# Patient Record
Sex: Male | Born: 1955 | Race: White | Hispanic: No | Marital: Married | Smoking: Never smoker
Health system: Southern US, Community
[De-identification: ages and names within clinical notes are randomized; demographics above are authoritative.]

## PROBLEM LIST (undated history)

## (undated) DIAGNOSIS — F329 Major depressive disorder, single episode, unspecified: Secondary | ICD-10-CM

## (undated) DIAGNOSIS — C801 Malignant (primary) neoplasm, unspecified: Secondary | ICD-10-CM

## (undated) DIAGNOSIS — K219 Gastro-esophageal reflux disease without esophagitis: Secondary | ICD-10-CM

## (undated) DIAGNOSIS — M1990 Unspecified osteoarthritis, unspecified site: Secondary | ICD-10-CM

## (undated) DIAGNOSIS — B1920 Unspecified viral hepatitis C without hepatic coma: Secondary | ICD-10-CM

## (undated) DIAGNOSIS — F32A Depression, unspecified: Secondary | ICD-10-CM

## (undated) DIAGNOSIS — G4730 Sleep apnea, unspecified: Secondary | ICD-10-CM

## (undated) DIAGNOSIS — F419 Anxiety disorder, unspecified: Secondary | ICD-10-CM

## (undated) DIAGNOSIS — K449 Diaphragmatic hernia without obstruction or gangrene: Secondary | ICD-10-CM

## (undated) DIAGNOSIS — R7303 Prediabetes: Secondary | ICD-10-CM

## (undated) DIAGNOSIS — D649 Anemia, unspecified: Secondary | ICD-10-CM

## (undated) DIAGNOSIS — I10 Essential (primary) hypertension: Secondary | ICD-10-CM

## (undated) DIAGNOSIS — T7840XA Allergy, unspecified, initial encounter: Secondary | ICD-10-CM

## (undated) DIAGNOSIS — E785 Hyperlipidemia, unspecified: Secondary | ICD-10-CM

## (undated) HISTORY — DX: Anxiety disorder, unspecified: F41.9

## (undated) HISTORY — DX: Major depressive disorder, single episode, unspecified: F32.9

## (undated) HISTORY — DX: Anemia, unspecified: D64.9

## (undated) HISTORY — DX: Sleep apnea, unspecified: G47.30

## (undated) HISTORY — DX: Unspecified osteoarthritis, unspecified site: M19.90

## (undated) HISTORY — DX: Essential (primary) hypertension: I10

## (undated) HISTORY — DX: Depression, unspecified: F32.A

## (undated) HISTORY — DX: Unspecified viral hepatitis C without hepatic coma: B19.20

## (undated) HISTORY — DX: Hyperlipidemia, unspecified: E78.5

## (undated) HISTORY — DX: Allergy, unspecified, initial encounter: T78.40XA

## (undated) HISTORY — DX: Malignant (primary) neoplasm, unspecified: C80.1

## (undated) HISTORY — PX: SHOULDER ACROMIOPLASTY: SHX6093

## (undated) HISTORY — DX: Gastro-esophageal reflux disease without esophagitis: K21.9

---

## 2016-10-14 ENCOUNTER — Other Ambulatory Visit: Payer: Self-pay | Admitting: Urology

## 2016-10-14 DIAGNOSIS — R972 Elevated prostate specific antigen [PSA]: Secondary | ICD-10-CM

## 2016-10-25 ENCOUNTER — Ambulatory Visit
Admission: RE | Admit: 2016-10-25 | Discharge: 2016-10-25 | Disposition: A | Discharge status: Home / Self Care | Payer: BLUE CROSS/BLUE SHIELD | Source: Ambulatory Visit | Attending: Urology | Admitting: Urology

## 2016-10-25 DIAGNOSIS — R972 Elevated prostate specific antigen [PSA]: Secondary | ICD-10-CM

## 2016-10-25 MED ORDER — GADOBENATE DIMEGLUMINE 529 MG/ML IV SOLN
20.0000 mL | Freq: Once | INTRAVENOUS | Status: AC | PRN
  Administered 2016-10-25: 20 mL via INTRAVENOUS

## 2018-01-25 ENCOUNTER — Other Ambulatory Visit (INDEPENDENT_AMBULATORY_CARE_PROVIDER_SITE_OTHER): Payer: BLUE CROSS/BLUE SHIELD

## 2018-01-25 ENCOUNTER — Encounter: Payer: Self-pay | Admitting: Gastroenterology

## 2018-01-25 ENCOUNTER — Ambulatory Visit (INDEPENDENT_AMBULATORY_CARE_PROVIDER_SITE_OTHER): Payer: BLUE CROSS/BLUE SHIELD | Admitting: Gastroenterology

## 2018-01-25 VITALS — BP 148/76 | HR 66 | Ht 72.0 in | Wt 273.0 lb

## 2018-01-25 DIAGNOSIS — R142 Eructation: Secondary | ICD-10-CM | POA: Diagnosis not present

## 2018-01-25 DIAGNOSIS — D509 Iron deficiency anemia, unspecified: Secondary | ICD-10-CM

## 2018-01-25 DIAGNOSIS — K2270 Barrett's esophagus without dysplasia: Secondary | ICD-10-CM | POA: Diagnosis not present

## 2018-01-25 DIAGNOSIS — K219 Gastro-esophageal reflux disease without esophagitis: Secondary | ICD-10-CM

## 2018-01-25 DIAGNOSIS — R197 Diarrhea, unspecified: Secondary | ICD-10-CM

## 2018-01-25 LAB — SEDIMENTATION RATE: SED RATE: 9 mm/h (ref 0–20)

## 2018-01-25 LAB — C-REACTIVE PROTEIN: CRP: 0.3 mg/dL — ABNORMAL LOW (ref 0.5–20.0)

## 2018-01-25 NOTE — Patient Instructions (Signed)
If you are age 60 or older, your body mass index should be between 23-30. Your Body mass index is 37.03 kg/m. If this is out of the aforementioned range listed, please consider follow up with your Primary Care Provider.  If you are age 63 or younger, your body mass index should be between 19-25. Your Body mass index is 37.03 kg/m. If this is out of the aformentioned range listed, please consider follow up with your Primary Care Provider.   .Please go to the lab on the 2nd floor [address] before you leave the office today.   It was a pleasure to see you today!  Vito Cirigliano, D.O.

## 2018-01-25 NOTE — Progress Notes (Signed)
Chief Complaint: Diarrhea   Referring Provider:     Anthony Sar, PA-C    HPI:     Chase Ramos is a 62 y.o. male referred to the Gastroenterology Clinic for evaluation of diarrhea and gas.  Was seen by his PCM on 01/10/18-sent GI path panel, C. difficile, started on simethicone, GI referral. He never submitted stool studies.   Today, he states he has had 6 weeks of increased flatus, belching, Ans diarrhea. Some periods of improvement, but not resolution. +nocturnal sxs. Having up to 20+ BMs/day on some days. Liquid, watery stools. No hematochezia or melena. Increased belching too. No preceding Abx, hospitalizations, but was started on Meloxicam approx 3 months ago for BPH.Traveled to [place] in August (pre sxs).  No prior similar sxs. No known family history of CRC, GI malignancy, liver disease, pancreatic disease, or IBD. 15-20# gain in last 2+ months despite sxs.    Past Medical History:  Diagnosis Date  . Anxiety   . Arthritis   . Depression   . GERD (gastroesophageal reflux disease)   . Hepatitis C   . HTN (hypertension)   . Sleep apnea     He was previously seen by GI at [facility] in March 2019 for routine CRC screening. He reports colonoscopy completed and n/f polyps with recommendation to repeat in 5 years. This report not in EMR but consultation notes available for review in EMR.  Initial colonoscopy completed at age 28 in [place] with polyps removed (unsure of location, number, histology).  Was then seen by [facility] GI and 2016 for GERD and ongoing polyp surveillance and iron deficiency anemia, with plan for EGD and colonoscopy and hematology referral.  Colonoscopy notable for diverticulosis with very large mouth diverticula, normal TI, small internal hemorrhoids, retained stool which limited visibility with recommendation to repeat in 1 year.  EGD notable for Barrett's esophagus (2 tongues extending 2 cm from the Z  line), 2 cm hiatal hernia, gastritis, fundic gland polyps, small paraesophageal hernia, normal duodenum.  Prior history of hepatitis C previously treated in [place] with Pegasys therapy in 2010 with SVR.  Recent labs notable in 06/2015 notable for normal CBC, (MCV 70/RDW 17.8), liver enzymes, BMP (creatinine 1.29).  Iron 26, ferritin 5, iron saturation 6% with TIBC 451.  History reviewed. No pertinent surgical history. Family History  Problem Relation Age of Onset  . Colon cancer Neg Hx    Social History   Tobacco Use  . Smoking status: Never Smoker  . Smokeless tobacco: Never Used  Substance Use Topics  . Alcohol use: Yes  . Drug use: Not on file   Current Outpatient Medications  Medication Sig Dispense Refill  . alfuzosin (UROXATRAL) 10 MG 24 hr tablet Take 10 mg by mouth daily with breakfast.    . azelastine (ASTELIN) 0.1 % nasal spray Place into both nostrils 2 (two) times daily. Use in each nostril as directed    . esomeprazole (NEXIUM) 20 MG capsule Take 20 mg by mouth daily at 12 noon.    . fluticasone (FLONASE) 50 MCG/ACT nasal spray Place into both nostrils daily.    . meloxicam (MOBIC) 15 MG tablet Take 15 mg by mouth daily.    . montelukast (SINGULAIR) 10 MG tablet Take 10 mg by mouth at bedtime.    . pramipexole (MIRAPEX) 1 MG tablet Take 1 mg by mouth 3 (three) times daily.    Marland Kitchen  valsartan-hydrochlorothiazide (DIOVAN-HCT) 320-12.5 MG tablet Take 1 tablet by mouth daily.     No current facility-administered medications for this visit.    No Known Allergies   Review of Systems: All systems reviewed and negative except where noted in HPI.     Physical Exam:    Wt Readings from Last 3 Encounters:  01/25/18 273 lb (123.8 kg)    BP (!) 148/76   Pulse 66   Ht 6' (1.829 m)   Wt 273 lb (123.8 kg)   BMI 37.03 kg/m  Constitutional:  Pleasant, in no acute distress. Psychiatric: Normal mood and affect. Behavior is normal. EENT: Pupils normal.  Conjunctivae  are normal. No scleral icterus. Neck supple. No cervical LAD. Cardiovascular: Normal rate, regular rhythm. No edema Pulmonary/chest: Effort normal and breath sounds normal. No wheezing, rales or rhonchi. Abdominal: Soft, nondistended, nontender. Bowel sounds active throughout. There are no masses palpable. No hepatomegaly. Neurological: Alert and oriented to person place and time. Skin: Skin is warm and dry. No rashes noted.   ASSESSMENT AND PLAN;   Chase Ramos is a 62 y.o. male presenting with:  1) Diarrhea: - Stool studies and ESR/CRP - If unrevealing, will proceed with EGD and colonoscopy with random and directed biopsies -Low FODMAP diet  2) BE:  Previous history of short segment (2 cm) Barrett's esophagus diagnosed at [facility] in 2016, without any repeat EGD.  Discussed most current societal guidelines, which recommend repeat EGD within 1 year of initial diagnosis to rule out any overlooked dysplasia.  Otherwise, appropriately on PPI therapy.  -Will need repeat EGD.  We will coordinate timing and whether or not this done in conjunction with colonoscopy pending above work-up - Resume Nexium  3) GERD: - Controlled with Nexium -Resume antireflux lifestyle measures  4) Iron def without anemia: - Duodenal bxs at time of EGD.  Does not appear these were done at time of his last EGD - May need to consider small bowel interrogation  - Ongoing for years per EMR. No prior small bowel interrogation noted on review of notes -May consider hematology referral  RTC in 1 to 2 months or sooner as needed  I spent a total of 45 minutes of face-to-face time with the patient. Greater than 50% of the time was spent counseling and coordinating care.     [name], DO, FACG  01/25/2018, 12:40 PM   [facility], [facility]  

## 2018-01-26 ENCOUNTER — Other Ambulatory Visit: Payer: BLUE CROSS/BLUE SHIELD

## 2018-01-26 DIAGNOSIS — R142 Eructation: Secondary | ICD-10-CM

## 2018-01-26 DIAGNOSIS — K2270 Barrett's esophagus without dysplasia: Secondary | ICD-10-CM

## 2018-01-26 DIAGNOSIS — R197 Diarrhea, unspecified: Secondary | ICD-10-CM

## 2018-01-29 LAB — FECAL FAT, QUALITATIVE: FECAL FAT, QUALITATIVE: NORMAL

## 2018-02-01 ENCOUNTER — Telehealth: Payer: Self-pay | Admitting: Gastroenterology

## 2018-02-01 LAB — FECAL LACTOFERRIN, QUANT
Fecal Lactoferrin: NEGATIVE
MICRO NUMBER:: 91225425
SPECIMEN QUALITY: ADEQUATE

## 2018-02-01 LAB — GASTROINTESTINAL PATHOGEN PANEL PCR
C. difficile Tox A/B, PCR: NOT DETECTED
CAMPYLOBACTER, PCR: NOT DETECTED
CRYPTOSPORIDIUM, PCR: NOT DETECTED
E COLI (STEC) STX1/STX2, PCR: NOT DETECTED
E coli (ETEC) LT/ST PCR: NOT DETECTED
E coli 0157, PCR: NOT DETECTED
Giardia lamblia, PCR: NOT DETECTED
NOROVIRUS, PCR: NOT DETECTED
ROTAVIRUS, PCR: NOT DETECTED
SALMONELLA, PCR: NOT DETECTED
Shigella, PCR: NOT DETECTED

## 2018-02-01 NOTE — Telephone Encounter (Signed)
Patient requesting results

## 2018-02-01 NOTE — Telephone Encounter (Signed)
Still waiting on the GI path panel, but the fecal lactoferrin is negative fecal fat is negative and normal ESR and CRP.  So all is normal so far.  Will contact when GI path panel has resulted. Thank you.

## 2018-02-01 NOTE — Telephone Encounter (Signed)
Patient notified of the results  

## 2018-02-05 ENCOUNTER — Ambulatory Visit (AMBULATORY_SURGERY_CENTER): Payer: Self-pay

## 2018-02-05 VITALS — Ht 72.0 in | Wt 273.0 lb

## 2018-02-05 DIAGNOSIS — R197 Diarrhea, unspecified: Secondary | ICD-10-CM

## 2018-02-05 MED ORDER — NA SULFATE-K SULFATE-MG SULF 17.5-3.13-1.6 GM/177ML PO SOLN: 1.0000 | Freq: Once | ORAL | 0 refills | Status: AC

## 2018-02-05 NOTE — Progress Notes (Signed)
Denies allergies to eggs or soy products. Denies complication of anesthesia or sedation. Denies use of weight loss medication. Denies use of O2.   Emmi instructions declined.  

## 2018-02-07 ENCOUNTER — Encounter: Payer: Self-pay | Admitting: Gastroenterology

## 2018-02-21 ENCOUNTER — Encounter: Payer: BLUE CROSS/BLUE SHIELD | Admitting: Gastroenterology

## 2018-03-01 ENCOUNTER — Ambulatory Visit (AMBULATORY_SURGERY_CENTER): Payer: BLUE CROSS/BLUE SHIELD | Admitting: Gastroenterology

## 2018-03-01 ENCOUNTER — Encounter: Payer: Self-pay | Admitting: Gastroenterology

## 2018-03-01 VITALS — BP 121/70 | HR 77 | Temp 96.9°F | Resp 20 | Ht 72.0 in | Wt 273.0 lb

## 2018-03-01 DIAGNOSIS — K635 Polyp of colon: Secondary | ICD-10-CM

## 2018-03-01 DIAGNOSIS — K5730 Diverticulosis of large intestine without perforation or abscess without bleeding: Secondary | ICD-10-CM

## 2018-03-01 DIAGNOSIS — K2970 Gastritis, unspecified, without bleeding: Secondary | ICD-10-CM

## 2018-03-01 DIAGNOSIS — K2270 Barrett's esophagus without dysplasia: Secondary | ICD-10-CM

## 2018-03-01 DIAGNOSIS — K219 Gastro-esophageal reflux disease without esophagitis: Secondary | ICD-10-CM | POA: Diagnosis not present

## 2018-03-01 DIAGNOSIS — R197 Diarrhea, unspecified: Secondary | ICD-10-CM | POA: Diagnosis not present

## 2018-03-01 DIAGNOSIS — D122 Benign neoplasm of ascending colon: Secondary | ICD-10-CM

## 2018-03-01 DIAGNOSIS — K449 Diaphragmatic hernia without obstruction or gangrene: Secondary | ICD-10-CM | POA: Diagnosis not present

## 2018-03-01 DIAGNOSIS — K2990 Gastroduodenitis, unspecified, without bleeding: Secondary | ICD-10-CM

## 2018-03-01 MED ORDER — SODIUM CHLORIDE 0.9 % IV SOLN: 500.0000 mL | Freq: Once | INTRAVENOUS | Status: DC

## 2018-03-01 NOTE — Patient Instructions (Signed)
Handouts Provided:  Polys, Gastritis, and Hiatal Hernia  Use Fiber, for example:  Citrucel, Fibercon, Konsyl, or Metamucil  YOU HAD AN ENDOSCOPIC PROCEDURE TODAY AT [place]:   Refer to the procedure report that was given to you for any specific questions about what was found during the examination.  If the procedure report does not answer your questions, please call your gastroenterologist to clarify.  If you requested that your care partner not be given the details of your procedure findings, then the procedure report has been included in a sealed envelope for you to review at your convenience later.  YOU SHOULD EXPECT: Some feelings of bloating in the abdomen. Passage of more gas than usual.  Walking can help get rid of the air that was put into your GI tract during the procedure and reduce the bloating. If you had a lower endoscopy (such as a colonoscopy or flexible sigmoidoscopy) you may notice spotting of blood in your stool or on the toilet paper. If you underwent a bowel prep for your procedure, you may not have a normal bowel movement for a few days.  Please Note:  You might notice some irritation and congestion in your nose or some drainage.  This is from the oxygen used during your procedure.  There is no need for concern and it should clear up in a day or so.  SYMPTOMS TO REPORT IMMEDIATELY:   Following lower endoscopy (colonoscopy or flexible sigmoidoscopy):  Excessive amounts of blood in the stool  Significant tenderness or worsening of abdominal pains  Swelling of the abdomen that is new, acute  Fever of 100F or higher   Following upper endoscopy (EGD)  Vomiting of blood or coffee ground material  New chest pain or pain under the shoulder blades  Painful or persistently difficult swallowing  New shortness of breath  Fever of 100F or higher  Black, tarry-looking stools  For urgent or emergent issues, a gastroenterologist can be reached at any hour by  calling [phone].   DIET:  We do recommend a small meal at first, but then you may proceed to your regular diet.  Drink plenty of fluids but you should avoid alcoholic beverages for 24 hours.  ACTIVITY:  You should plan to take it easy for the rest of today and you should NOT DRIVE or use heavy machinery until tomorrow (because of the sedation medicines used during the test).    FOLLOW UP: Our staff will call the number listed on your records the next business day following your procedure to check on you and address any questions or concerns that you may have regarding the information given to you following your procedure. If we do not reach you, we will leave a message.  However, if you are feeling well and you are not experiencing any problems, there is no need to return our call.  We will assume that you have returned to your regular daily activities without incident.  If any biopsies were taken you will be contacted by phone or by letter within the next 1-3 weeks.  Please call us at [phone] if you have not heard about the biopsies in 3 weeks.    SIGNATURES/CONFIDENTIALITY: You and/or your care partner have signed paperwork which will be entered into your electronic medical record.  These signatures attest to the fact that that the information above on your After Visit Summary has been reviewed and is understood.  Full responsibility of the confidentiality of this  discharge information lies with you and/or your care-partner.

## 2018-03-01 NOTE — Progress Notes (Signed)
Called to room to assist during endoscopic procedure.  Patient ID and intended procedure confirmed with present staff. Received instructions for my participation in the procedure from the performing physician.  

## 2018-03-01 NOTE — Op Note (Signed)
[place] Patient Name: Chase Ramos Procedure Date: 03/01/2018 1:58 PM MRN: [id number] Endoscopist: Gerrit Heck , MD Age: 62 Referring MD:  Date of Birth: [birth year] Gender: Male Account #: [id number] Procedure:                Upper GI endoscopy Indications:              Esophageal reflux, Follow-up of Barrett's                            esophagus, Diarrhea, Eructation Medicines:                Monitored Anesthesia Care Procedure:                Pre-Anesthesia Assessment:                           - Prior to the procedure, a History and Physical                            was performed, and patient medications and                            allergies were reviewed. The patient's tolerance of                            previous anesthesia was also reviewed. The risks                            and benefits of the procedure and the sedation                            options and risks were discussed with the patient.                            All questions were answered, and informed consent                            was obtained. Prior Anticoagulants: The patient has                            taken no previous anticoagulant or antiplatelet                            agents. ASA Grade Assessment: II - A patient with                            mild systemic disease. After reviewing the risks                            and benefits, the patient was deemed in                            satisfactory condition to undergo the procedure.  After obtaining informed consent, the endoscope was                            passed under direct vision. Throughout the                            procedure, the patient's blood pressure, pulse, and                            oxygen saturations were monitored continuously. The                            Model GIF-HQ190 [id number]) scope was introduced                            through the mouth, and  advanced to the second part                            of duodenum. The upper GI endoscopy was                            accomplished without difficulty. The patient                            tolerated the procedure well. Scope In: Scope Out: Findings:                 Esophagogastric landmarks were identified: the                            Z-line was found at 33 cm, the gastroesophageal                            junction was found at 35 cm and the site of hiatal                            narrowing was found at 44 cm from the incisors.                           A 9 cm hernia was present.                           The esophagus and gastroesophageal junction were                            examined with white light. There were esophageal                            mucosal changes classified as Barrett's stage C1-M2                            per Prague criteria. These changes involved the  mucosa at the upper extent of the gastric folds (35                            cm from the incisors) extending to the Z-line (33                            cm from the incisors). Circumferential                            salmon-colored mucosa was present from 34 to 35 cm                            and two tongues of salmon-colored mucosa were                            present from 33 to 34 cm. The maximum longitudinal                            extent of these esophageal mucosal changes was 2 cm                            in length. Mucosa was biopsied with a cold forceps                            for histology in 4 quadrants at intervals of 2 cm                            at 33 and 35 cm from the incisors. A total of 2                            specimen bottles were sent to pathology. Estimated                            blood loss was minimal.                           A large type-III paraesophageal hernia was found.                            The proximal extent of  the gastric folds (end of                            tubular esophagus) was 35 cm from the incisors. The                            hiatal narrowing was 44 cm from the incisors.                           Patchy mildly erythematous mucosa without bleeding                            was found in  the gastric fundus and in the gastric                            body. Biopsies were taken with a cold forceps for                            Helicobacter pylori testing. Estimated blood loss                            was minimal.                           The gastric antrum and pylorus were normal.                            Biopsies were taken with a cold forceps for                            Helicobacter pylori testing. Estimated blood loss                            was minimal.                           The duodenal bulb, first portion of the duodenum                            and second portion of the duodenum were normal.                            Biopsies for histology were taken with a cold                            forceps for evaluation of celiac disease. Estimated                            blood loss was minimal. Complications:            No immediate complications. Estimated Blood Loss:     Estimated blood loss was minimal. Impression:               - Esophagogastric landmarks identified.                           - 9 cm hiatal hernia. Endoscopic appearance seems                            most consistent with type 3 hernia.                           - Esophageal mucosal changes classified as                            Barrett's stage C1-M2 per Prague criteria. Biopsied.                           -  Erythematous mucosa in the gastric fundus and                            gastric body. Biopsied.                           - Normal antrum and pylorus. Biopsied.                           - Normal duodenal bulb, first portion of the                            duodenum and second portion  of the duodenum.                            Biopsied. Recommendation:           - Patient has a contact number available for                            emergencies. The signs and symptoms of potential                            delayed complications were discussed with the                            patient. Return to normal activities tomorrow.                            Written discharge instructions were provided to the                            patient.                           - Resume previous diet today.                           - Continue present medications.                           - Await pathology results.                           - Refer to a surgeon at appointment to be scheduled. Gerrit Heck, MD 03/01/2018 2:49:53 PM

## 2018-03-01 NOTE — Progress Notes (Signed)
Report to PACU, RN, vss, BBS= Clear.  

## 2018-03-01 NOTE — Progress Notes (Signed)
Pt's states no medical or surgical changes since previsit or office visit. 

## 2018-03-01 NOTE — Op Note (Signed)
[place] Patient Name: Chase Ramos Procedure Date: 03/01/2018 1:57 PM MRN: [id number] Endoscopist: Gerrit Heck , MD Age: 62 Referring MD:  Date of Birth: 1955/12/16 Gender: Male Account #: [id number] Procedure:                Colonoscopy Indications:              Diarrhea Medicines:                Monitored Anesthesia Care Procedure:                Pre-Anesthesia Assessment:                           - Prior to the procedure, a History and Physical                            was performed, and patient medications and                            allergies were reviewed. The patient's tolerance of                            previous anesthesia was also reviewed. The risks                            and benefits of the procedure and the sedation                            options and risks were discussed with the patient.                            All questions were answered, and informed consent                            was obtained. Prior Anticoagulants: The patient has                            taken no previous anticoagulant or antiplatelet                            agents. ASA Grade Assessment: II - A patient with                            mild systemic disease. After reviewing the risks                            and benefits, the patient was deemed in                            satisfactory condition to undergo the procedure.                           After obtaining informed consent, the colonoscope  was passed under direct vision. Throughout the                            procedure, the patient's blood pressure, pulse, and                            oxygen saturations were monitored continuously. The                            Model CF-HQ190L [id number]) scope was introduced                            through the anus and advanced to the the cecum,                            identified by appendiceal orifice and ileocecal                          valve. The colonoscopy was performed without                            difficulty. The patient tolerated the procedure                            well. The quality of the bowel preparation was fair. Scope In: 2:18:44 PM Scope Out: 2:38:54 PM Scope Withdrawal Time: 0 hours 16 minutes 0 seconds  Total Procedure Duration: 0 hours 20 minutes 10 seconds  Findings:                 A 4 mm polyp was found in the ascending colon. The                            polyp was sessile. The polyp was removed with a                            cold snare. Resection and retrieval were complete.                            Estimated blood loss was minimal.                           The perianal and digital rectal examinations were                            normal.                           There was a small lipoma, in the distal transverse                            colon. Biopsies were taken via bite-on-bite                            technique with a cold forceps for histology.  Estimated blood loss was minimal.                           Many small and large-mouthed diverticula were found                            in the sigmoid colon, descending colon and                            ascending colon.                           A moderate amount of semi-solid stool was found in                            the proximal ascending colon and in the cecum,                            interfering with visualization. Lavage of the area                            was performed using copious amounts of sterile                            water, resulting in incomplete clearance with fair                            visualization.                           Normal mucosa was found in the entire colon.                            Biopsies for histology were taken with a cold                            forceps from the right colon and left colon for                             evaluation of microscopic colitis. Estimated blood                            loss was minimal.                           The retroflexed view of the distal rectum and anal                            verge was normal and showed no anal or rectal                            abnormalities. Complications:            No immediate complications. Estimated Blood Loss:     Estimated blood loss was minimal. Impression:               -  Preparation of the colon was fair.                           - One 4 mm polyp in the ascending colon, removed                            with a cold snare. Resected and retrieved.                           - Small lipoma in the distal transverse colon.                            Biopsied.                           - Diverticulosis in the sigmoid colon, in the                            descending colon and in the ascending colon.                           - Stool in the proximal ascending colon and in the                            cecum.                           - Normal mucosa in the entire examined colon.                            Biopsied.                           - The distal rectum and anal verge are normal on                            retroflexion view. Recommendation:           - Patient has a contact number available for                            emergencies. The signs and symptoms of potential                            delayed complications were discussed with the                            patient. Return to normal activities tomorrow.                            Written discharge instructions were provided to the                            patient.                           -  Resume previous diet today.                           - Continue present medications.                           - Await pathology results.                           - Use fiber, for example Citrucel, Fibercon, Konsyl                            or Metamucil.                            - Return to GI clinic PRN. Gerrit Heck, MD 03/01/2018 2:55:17 PM

## 2018-03-02 ENCOUNTER — Telehealth: Payer: Self-pay

## 2018-03-02 NOTE — Telephone Encounter (Signed)
  Follow up Call-  Call back number 03/01/2018  Post procedure Call Back phone  # [phone]  Permission to leave phone message Yes     Patient questions:  Do you have a fever, pain , or abdominal swelling? No. Pain Score  0 *  Have you tolerated food without any problems? Yes.    Have you been able to return to your normal activities? Yes.    Do you have any questions about your discharge instructions: Diet   No. Medications  No. Follow up visit  No.  Do you have questions or concerns about your Care? No.  Actions: * If pain score is 4 or above: No action needed, pain <4.

## 2018-03-13 NOTE — Progress Notes (Signed)
Per [facility] procedure report, hernia repair [facility] notified office the referral is set up with Dr. Redmond Pulling on 03/21/18 arrival at 8:30 am for appt at 9:00 am; [facility] rep stated they would contact the patient and notify of appt that has been set up;

## 2018-03-28 ENCOUNTER — Encounter: Payer: Self-pay | Admitting: Gastroenterology

## 2018-04-03 ENCOUNTER — Other Ambulatory Visit: Payer: Self-pay | Admitting: General Surgery

## 2018-04-03 DIAGNOSIS — K219 Gastro-esophageal reflux disease without esophagitis: Secondary | ICD-10-CM

## 2018-04-03 DIAGNOSIS — K449 Diaphragmatic hernia without obstruction or gangrene: Principal | ICD-10-CM

## 2018-04-09 ENCOUNTER — Other Ambulatory Visit: Payer: BLUE CROSS/BLUE SHIELD

## 2018-04-19 ENCOUNTER — Ambulatory Visit
Admission: RE | Admit: 2018-04-19 | Discharge: 2018-04-19 | Disposition: A | Discharge status: Home / Self Care | Payer: BLUE CROSS/BLUE SHIELD | Source: Ambulatory Visit | Attending: General Surgery | Admitting: General Surgery

## 2018-04-19 DIAGNOSIS — K449 Diaphragmatic hernia without obstruction or gangrene: Principal | ICD-10-CM

## 2018-04-19 DIAGNOSIS — K219 Gastro-esophageal reflux disease without esophagitis: Secondary | ICD-10-CM

## 2018-04-25 ENCOUNTER — Other Ambulatory Visit: Payer: Self-pay | Admitting: General Surgery

## 2018-04-25 ENCOUNTER — Ambulatory Visit
Admission: RE | Admit: 2018-04-25 | Discharge: 2018-04-25 | Disposition: A | Discharge status: Home / Self Care | Payer: BLUE CROSS/BLUE SHIELD | Source: Ambulatory Visit | Attending: General Surgery | Admitting: General Surgery

## 2018-04-25 ENCOUNTER — Encounter: Payer: Self-pay | Admitting: Radiology

## 2018-04-25 DIAGNOSIS — K219 Gastro-esophageal reflux disease without esophagitis: Secondary | ICD-10-CM

## 2018-04-25 DIAGNOSIS — K449 Diaphragmatic hernia without obstruction or gangrene: Principal | ICD-10-CM

## 2018-04-25 MED ORDER — IOPAMIDOL (ISOVUE-300) INJECTION 61%
100.0000 mL | Freq: Once | INTRAVENOUS | Status: AC | PRN
  Administered 2018-04-25: 100 mL via INTRAVENOUS

## 2018-04-26 ENCOUNTER — Ambulatory Visit: Payer: Self-pay | Admitting: General Surgery

## 2018-05-29 NOTE — Patient Instructions (Addendum)
Chase Ramos  05/29/2018   Your procedure is scheduled on: 06-05-2018    Report to [facility] Main  Entrance     Report to admitting at 5:30AM    Call this number if you have problems the morning of surgery [phone]     PLEASE BRING CPAP MASK AND  TUBING ONLY. DEVICE WILL BE PROVIDED!   Remember: Do not eat food or drink liquids :After Midnight. BRUSH YOUR TEETH MORNING OF SURGERY AND RINSE YOUR MOUTH OUT, NO CHEWING GUM CANDY OR MINTS.     Take these medicines the morning of surgery with A SIP OF WATER: NEXIUM, FINASTERIDE, FLONASE, MONTELUKAST, (TRAMIPEXOLE)MIRAPEX                                You may not have any metal on your body including hair pins and              piercings  Do not wear jewelry, make-up, lotions, powders or perfumes, deodorant              Men may shave face and neck.   Do not bring valuables to the hospital. [place].  Contacts, dentures or bridgework may not be worn into surgery.  Leave suitcase in the car. After surgery it may be brought to your room.                  Please read over the following fact sheets you were given: _____________________________________________________________________             [facility] - Preparing for Surgery Before surgery, you can play an important role.  Because skin is not sterile, your skin needs to be as free of germs as possible.  You can reduce the number of germs on your skin by washing with CHG (chlorahexidine gluconate) soap before surgery.  CHG is an antiseptic cleaner which kills germs and bonds with the skin to continue killing germs even after washing. Please DO NOT use if you have an allergy to CHG or antibacterial soaps.  If your skin becomes reddened/irritated stop using the CHG and inform your nurse when you arrive at Short Stay. Do not shave (including legs and underarms) for at least 48 hours prior to the first  CHG shower.  You may shave your face/neck. Please follow these instructions carefully:  1.  Shower with CHG Soap the night before surgery and the  morning of Surgery.  2.  If you choose to wash your hair, wash your hair first as usual with your  normal  shampoo.  3.  After you shampoo, rinse your hair and body thoroughly to remove the  shampoo.                           4.  Use CHG as you would any other liquid soap.  You can apply chg directly  to the skin and wash                       Gently with a scrungie or clean washcloth.  5.  Apply the CHG Soap to your body ONLY FROM THE NECK DOWN.   Do not  use on face/ open                           Wound or open sores. Avoid contact with eyes, ears mouth and genitals (private parts).                       Wash face,  Genitals (private parts) with your normal soap.             6.  Wash thoroughly, paying special attention to the area where your surgery  will be performed.  7.  Thoroughly rinse your body with warm water from the neck down.  8.  DO NOT shower/wash with your normal soap after using and rinsing off  the CHG Soap.                9.  Pat yourself dry with a clean towel.            10.  Wear clean pajamas.            11.  Place clean sheets on your bed the night of your first shower and do not  sleep with pets. Day of Surgery : Do not apply any lotions/deodorants the morning of surgery.  Please wear clean clothes to the hospital/surgery center.  FAILURE TO FOLLOW THESE INSTRUCTIONS MAY RESULT IN THE CANCELLATION OF YOUR SURGERY PATIENT SIGNATURE_________________________________  NURSE SIGNATURE__________________________________  ________________________________________________________________________   Chase Ramos  An incentive spirometer is a tool that can help keep your lungs clear and active. This tool measures how well you are filling your lungs with each breath. Taking long deep breaths may help reverse or decrease the  chance of developing breathing (pulmonary) problems (especially infection) following:  A long period of time when you are unable to move or be active. BEFORE THE PROCEDURE   If the spirometer includes an indicator to show your best effort, your nurse or respiratory therapist will set it to a desired goal.  If possible, sit up straight or lean slightly forward. Try not to slouch.  Hold the incentive spirometer in an upright position. INSTRUCTIONS FOR USE  1. Sit on the edge of your bed if possible, or sit up as far as you can in bed or on a chair. 2. Hold the incentive spirometer in an upright position. 3. Breathe out normally. 4. Place the mouthpiece in your mouth and seal your lips tightly around it. 5. Breathe in slowly and as deeply as possible, raising the piston or the ball toward the top of the column. 6. Hold your breath for 3-5 seconds or for as long as possible. Allow the piston or ball to fall to the bottom of the column. 7. Remove the mouthpiece from your mouth and breathe out normally. 8. Rest for a few seconds and repeat Steps 1 through 7 at least 10 times every 1-2 hours when you are awake. Take your time and take a few normal breaths between deep breaths. 9. The spirometer may include an indicator to show your best effort. Use the indicator as a goal to work toward during each repetition. 10. After each set of 10 deep breaths, practice coughing to be sure your lungs are clear. If you have an incision (the cut made at the time of surgery), support your incision when coughing by placing a pillow or rolled up towels firmly against it. Once you are able to get out of bed, walk  around indoors and cough well. You may stop using the incentive spirometer when instructed by your caregiver.  RISKS AND COMPLICATIONS  Take your time so you do not get dizzy or light-headed.  If you are in pain, you may need to take or ask for pain medication before doing incentive spirometry. It is harder  to take a deep breath if you are having pain. AFTER USE  Rest and breathe slowly and easily.  It can be helpful to keep track of a log of your progress. Your caregiver can provide you with a simple table to help with this. If you are using the spirometer at home, follow these instructions: [place] IF:   You are having difficultly using the spirometer.  You have trouble using the spirometer as often as instructed.  Your pain medication is not giving enough relief while using the spirometer.  You develop fever of 100.5 F (38.1 C) or higher. SEEK IMMEDIATE MEDICAL CARE IF:   You cough up bloody sputum that had not been present before.  You develop fever of 102 F (38.9 C) or greater.  You develop worsening pain at or near the incision site. MAKE SURE YOU:   Understand these instructions.  Will watch your condition.  Will get help right away if you are not doing well or get worse. Document Released: 08/15/2006 Document Revised: 06/27/2011 Document Reviewed: 10/16/2006 [facility] Patient Information 2014 [place], [place].   ________________________________________________________________________

## 2018-05-29 NOTE — Progress Notes (Signed)
CXR 06-19-17 Epic CARE EVERYWHERE   ECHO 06-02-17 Epic CARE EVERYWHERE

## 2018-05-30 ENCOUNTER — Encounter (HOSPITAL_COMMUNITY)
Admission: RE | Admit: 2018-05-30 | Discharge: 2018-05-30 | Disposition: A | Discharge status: Home / Self Care | Payer: BLUE CROSS/BLUE SHIELD | Source: Ambulatory Visit | Attending: General Surgery | Admitting: General Surgery

## 2018-05-30 ENCOUNTER — Encounter (HOSPITAL_COMMUNITY): Payer: Self-pay

## 2018-05-30 ENCOUNTER — Other Ambulatory Visit: Payer: Self-pay

## 2018-05-30 DIAGNOSIS — I10 Essential (primary) hypertension: Secondary | ICD-10-CM | POA: Diagnosis not present

## 2018-05-30 DIAGNOSIS — Z01818 Encounter for other preprocedural examination: Secondary | ICD-10-CM | POA: Insufficient documentation

## 2018-05-30 HISTORY — DX: Prediabetes: R73.03

## 2018-05-30 HISTORY — DX: Diaphragmatic hernia without obstruction or gangrene: K44.9

## 2018-05-30 LAB — CBC WITH DIFFERENTIAL/PLATELET
Abs Immature Granulocytes: 0.09 10*3/uL — ABNORMAL HIGH (ref 0.00–0.07)
BASOS PCT: 1 %
Basophils Absolute: 0.1 10*3/uL (ref 0.0–0.1)
Eosinophils Absolute: 0.3 10*3/uL (ref 0.0–0.5)
Eosinophils Relative: 4 %
HCT: 40.2 % (ref 39.0–52.0)
Hemoglobin: 12.3 g/dL — ABNORMAL LOW (ref 13.0–17.0)
Immature Granulocytes: 1 %
Lymphocytes Relative: 17 %
Lymphs Abs: 1.3 10*3/uL (ref 0.7–4.0)
MCH: 23.8 pg — ABNORMAL LOW (ref 26.0–34.0)
MCHC: 30.6 g/dL (ref 30.0–36.0)
MCV: 77.8 fL — ABNORMAL LOW (ref 80.0–100.0)
Monocytes Absolute: 0.6 10*3/uL (ref 0.1–1.0)
Monocytes Relative: 8 %
Neutro Abs: 5.4 10*3/uL (ref 1.7–7.7)
Neutrophils Relative %: 69 %
PLATELETS: 252 10*3/uL (ref 150–400)
RBC: 5.17 MIL/uL (ref 4.22–5.81)
RDW: 16.8 % — ABNORMAL HIGH (ref 11.5–15.5)
WBC: 7.7 10*3/uL (ref 4.0–10.5)
nRBC: 0 % (ref 0.0–0.2)

## 2018-05-30 LAB — COMPREHENSIVE METABOLIC PANEL
ALT: 23 U/L (ref 0–44)
AST: 22 U/L (ref 15–41)
Albumin: 4 g/dL (ref 3.5–5.0)
Alkaline Phosphatase: 49 U/L (ref 38–126)
Anion gap: 6 (ref 5–15)
BUN: 24 mg/dL — ABNORMAL HIGH (ref 6–20)
CO2: 23 mmol/L (ref 22–32)
Calcium: 8.7 mg/dL — ABNORMAL LOW (ref 8.9–10.3)
Chloride: 108 mmol/L (ref 98–111)
Creatinine, Ser: 1.07 mg/dL (ref 0.61–1.24)
GFR calc Af Amer: >60 mL/min (ref >60)
GFR calc non Af Amer: >60 mL/min (ref >60)
Glucose, Bld: 128 mg/dL — ABNORMAL HIGH (ref 70–99)
POTASSIUM: 4 mmol/L (ref 3.5–5.1)
Sodium: 137 mmol/L (ref 135–145)
Total Bilirubin: 0.3 mg/dL (ref 0.3–1.2)
Total Protein: 6.8 g/dL (ref 6.5–8.1)

## 2018-05-30 LAB — ABO/RH: ABO/RH(D): B NEG

## 2018-05-30 LAB — HEMOGLOBIN A1C
Hgb A1c MFr Bld: 6.7 % — ABNORMAL HIGH (ref 4.8–5.6)
MEAN PLASMA GLUCOSE: 145.59 mg/dL

## 2018-05-31 NOTE — Progress Notes (Signed)
Anesthesia Chart Review   Case:  [id number] Date/Time:  06/05/18 0715   Procedure:  LAPAROSCOPIC PARAESOPHAGEAL HERNIA REPAIRWITH POSSIBLE MESH, PARTIAL FUNDOPLICATION, POSSIBLE UPPER ENDOSCOPY ERAS PATHWAY (N/A )   Anesthesia type:  General   Pre-op diagnosis:  paraesophageal hernia   Location:  [facility] ROOM 01 / [facility]   Surgeon:  Greer Pickerel, MD      DISCUSSION:62 yo never smoker with h/o HTN, depression, anxiety, OSA, anemia, pre-diabetes, GERD, Hepatitis C (Pegasys therapy in 2010 with SVR), HLD, paraesophageal hernia scheduled for above procedure 06/05/18 with Dr. Greer Pickerel.   Pt can proceed with planned procedure barring acute status change.  VS: BP 113/76   Pulse (!) 104   Temp 37 C (Oral)   Resp 18   SpO2 96%   PROVIDERS: Robert Bellow, PA-C is PCP last seen 04/03/18   LABS: Labs reviewed: Acceptable for surgery. (all labs ordered are listed, but only abnormal results are displayed)  Labs Reviewed  CBC WITH DIFFERENTIAL/PLATELET - Abnormal; Notable for the following components:      Result Value   Hemoglobin 12.3 (*)    MCV 77.8 (*)    MCH 23.8 (*)    RDW 16.8 (*)    Abs Immature Granulocytes 0.09 (*)    All other components within normal limits  COMPREHENSIVE METABOLIC PANEL - Abnormal; Notable for the following components:   Glucose, Bld 128 (*)    BUN 24 (*)    Calcium 8.7 (*)    All other components within normal limits  HEMOGLOBIN A1C - Abnormal; Notable for the following components:   Hgb A1c MFr Bld 6.7 (*)    All other components within normal limits  TYPE AND SCREEN  ABO/RH     IMAGES: CT Abdomen Pelvis 04/25/2018 IMPRESSION: Large hiatal hernia is noted with most of the stomach within the thoracic space. The gastroesophageal junction is at the level of the hiatus, and therefore it is difficult determine if this is paraesophageal or sliding-type hernia. There does appear to be significant narrowing involving the junction of the middle and distal  thirds of the stomach at the level of the hiatus which may be causing some degree of obstruction. Volvulus can not be excluded although is felt to be less likely.  Sigmoid diverticulosis without inflammation.  EKG: 05/30/2018 Rate 91 bpm Normal Sinus rhythm Normal ECG  CV: Echo 06/02/17 (on Care Everywhere) SUMMARY The left ventricular size is normal. Mild left ventricular hypertrophy  Left ventricular systolic function is normal. LV ejection fraction = 55-60%.  Left ventricular filling pattern is prolonged relaxation. The right ventricle is normal in size and function. There is no significant valvular stenosis or regurgitation IVC size was normal. There is no pericardial effusion. There is no comparison study available. Past Medical History:  Diagnosis Date  . Allergy   . Anemia   . Anxiety   . Arthritis   . Cancer ([place])    melanoma skin   . Depression   . GERD (gastroesophageal reflux disease)   . Hepatitis C   . HTN (hypertension)   . Hyperlipidemia   . Paraesophageal hernia   . Pre-diabetes   . Sleep apnea     Past Surgical History:  Procedure Laterality Date  . SHOULDER ACROMIOPLASTY Right age 35    MEDICATIONS: . azelastine (ASTELIN) 0.1 % nasal spray  . dicyclomine (BENTYL) 20 MG tablet  . esomeprazole (NEXIUM) 20 MG capsule  . Eszopiclone 3 MG TABS  . finasteride (PROSCAR) 5  MG tablet  . fluticasone (FLONASE) 50 MCG/ACT nasal spray  . meloxicam (MOBIC) 15 MG tablet  . montelukast (SINGULAIR) 10 MG tablet  . pramipexole (MIRAPEX) 1 MG tablet  . silodosin (RAPAFLO) 8 MG CAPS capsule  . valsartan-hydrochlorothiazide (DIOVAN-HCT) 320-25 MG tablet   No current facility-administered medications for this encounter.     Maia Plan [facility] Pre-Surgical Testing [phone] 05/31/18 2:28 PM

## 2018-05-31 NOTE — Anesthesia Preprocedure Evaluation (Addendum)
Anesthesia Evaluation  Patient identified by MRN, date of birth, ID band Patient awake    Reviewed: Allergy & Precautions, NPO status , Patient's Chart, lab work & pertinent test results  Airway Mallampati: III  TM Distance: >3 FB Neck ROM: Full    Dental  (+) Dental Advisory Given   Pulmonary sleep apnea ,    breath sounds clear to auscultation       Cardiovascular hypertension, Pt. on medications  Rhythm:Regular Rate:Normal  Echo 06/02/17 (on Care Everywhere) SUMMARY The left ventricular size is normal. Mild left ventricular hypertrophy  Left ventricular systolic function is normal. LV ejection fraction = 55-60%.  Left ventricular filling pattern is prolonged relaxation. The right ventricle is normal in size and function. There is no significant valvular stenosis or regurgitation IVC size was normal. There is no pericardial effusion. There is no comparison study available.   Neuro/Psych negative neurological ROS     GI/Hepatic hiatal hernia, GERD  ,(+) Hepatitis -, C  Endo/Other  negative endocrine ROS  Renal/GU negative Renal ROS     Musculoskeletal  (+) Arthritis ,   Abdominal   Peds  Hematology  (+) anemia ,   Anesthesia Other Findings   Reproductive/Obstetrics                           Lab Results  Component Value Date   WBC 7.7 05/30/2018   HGB 12.3 (L) 05/30/2018   HCT 40.2 05/30/2018   MCV 77.8 (L) 05/30/2018   PLT 252 05/30/2018   Lab Results  Component Value Date   CREATININE 1.07 05/30/2018   BUN 24 (H) 05/30/2018   NA 137 05/30/2018   K 4.0 05/30/2018   CL 108 05/30/2018   CO2 23 05/30/2018    Anesthesia Physical Anesthesia Plan  ASA: II  Anesthesia Plan: General   Post-op Pain Management:    Induction: Intravenous  PONV Risk Score and Plan: 3 and Dexamethasone, Ondansetron, Scopolamine patch - Pre-op and Treatment may vary due to age or medical  condition  Airway Management Planned: Oral ETT  Additional Equipment:   Intra-op Plan:   Post-operative Plan: Extubation in OR  Informed Consent: I have reviewed the patients History and Physical, chart, labs and discussed the procedure including the risks, benefits and alternatives for the proposed anesthesia with the patient or authorized representative who has indicated his/her understanding and acceptance.     Dental advisory given  Plan Discussed with: CRNA  Anesthesia Plan Comments:       Anesthesia Quick Evaluation

## 2018-06-04 MED ORDER — BUPIVACAINE LIPOSOME 1.3 % IJ SUSP
20.0000 mL | Freq: Once | INTRAMUSCULAR | Status: DC
  Filled 2018-06-04: qty 20

## 2018-06-05 ENCOUNTER — Encounter (HOSPITAL_COMMUNITY): Payer: Self-pay | Admitting: *Deleted

## 2018-06-05 ENCOUNTER — Observation Stay (HOSPITAL_COMMUNITY)
Admission: RE | Admit: 2018-06-05 | Discharge: 2018-06-06 | Disposition: A | Discharge status: Home / Self Care | Payer: BLUE CROSS/BLUE SHIELD | Attending: General Surgery | Admitting: General Surgery

## 2018-06-05 ENCOUNTER — Ambulatory Visit (HOSPITAL_COMMUNITY): Payer: BLUE CROSS/BLUE SHIELD | Admitting: Physician Assistant

## 2018-06-05 ENCOUNTER — Encounter (HOSPITAL_COMMUNITY)
Admission: RE | Disposition: A | Discharge status: Home / Self Care | Payer: Self-pay | Source: Home / Self Care | Attending: General Surgery

## 2018-06-05 ENCOUNTER — Ambulatory Visit (HOSPITAL_COMMUNITY): Payer: BLUE CROSS/BLUE SHIELD | Admitting: Registered Nurse

## 2018-06-05 ENCOUNTER — Other Ambulatory Visit: Payer: Self-pay

## 2018-06-05 DIAGNOSIS — Z8619 Personal history of other infectious and parasitic diseases: Secondary | ICD-10-CM | POA: Insufficient documentation

## 2018-06-05 DIAGNOSIS — F419 Anxiety disorder, unspecified: Secondary | ICD-10-CM | POA: Insufficient documentation

## 2018-06-05 DIAGNOSIS — D649 Anemia, unspecified: Secondary | ICD-10-CM | POA: Diagnosis not present

## 2018-06-05 DIAGNOSIS — Z791 Long term (current) use of non-steroidal anti-inflammatories (NSAID): Secondary | ICD-10-CM | POA: Insufficient documentation

## 2018-06-05 DIAGNOSIS — I517 Cardiomegaly: Secondary | ICD-10-CM | POA: Insufficient documentation

## 2018-06-05 DIAGNOSIS — K2950 Unspecified chronic gastritis without bleeding: Secondary | ICD-10-CM | POA: Diagnosis not present

## 2018-06-05 DIAGNOSIS — F329 Major depressive disorder, single episode, unspecified: Secondary | ICD-10-CM | POA: Diagnosis not present

## 2018-06-05 DIAGNOSIS — R0789 Other chest pain: Secondary | ICD-10-CM | POA: Insufficient documentation

## 2018-06-05 DIAGNOSIS — Z8719 Personal history of other diseases of the digestive system: Secondary | ICD-10-CM

## 2018-06-05 DIAGNOSIS — M1990 Unspecified osteoarthritis, unspecified site: Secondary | ICD-10-CM | POA: Diagnosis not present

## 2018-06-05 DIAGNOSIS — K449 Diaphragmatic hernia without obstruction or gangrene: Principal | ICD-10-CM | POA: Insufficient documentation

## 2018-06-05 DIAGNOSIS — E785 Hyperlipidemia, unspecified: Secondary | ICD-10-CM | POA: Insufficient documentation

## 2018-06-05 DIAGNOSIS — N401 Enlarged prostate with lower urinary tract symptoms: Secondary | ICD-10-CM | POA: Insufficient documentation

## 2018-06-05 DIAGNOSIS — Z6835 Body mass index (BMI) 35.0-35.9, adult: Secondary | ICD-10-CM | POA: Diagnosis not present

## 2018-06-05 DIAGNOSIS — K210 Gastro-esophageal reflux disease with esophagitis: Secondary | ICD-10-CM | POA: Insufficient documentation

## 2018-06-05 DIAGNOSIS — E669 Obesity, unspecified: Secondary | ICD-10-CM | POA: Diagnosis not present

## 2018-06-05 DIAGNOSIS — Z79899 Other long term (current) drug therapy: Secondary | ICD-10-CM | POA: Insufficient documentation

## 2018-06-05 DIAGNOSIS — Z85820 Personal history of malignant melanoma of skin: Secondary | ICD-10-CM | POA: Insufficient documentation

## 2018-06-05 DIAGNOSIS — I10 Essential (primary) hypertension: Secondary | ICD-10-CM | POA: Diagnosis not present

## 2018-06-05 DIAGNOSIS — R7303 Prediabetes: Secondary | ICD-10-CM | POA: Insufficient documentation

## 2018-06-05 DIAGNOSIS — G4733 Obstructive sleep apnea (adult) (pediatric): Secondary | ICD-10-CM | POA: Diagnosis not present

## 2018-06-05 DIAGNOSIS — Z9889 Other specified postprocedural states: Secondary | ICD-10-CM

## 2018-06-05 LAB — HEMOGLOBIN AND HEMATOCRIT, BLOOD
HCT: 43.4 % (ref 39.0–52.0)
Hemoglobin: 13.1 g/dL (ref 13.0–17.0)

## 2018-06-05 LAB — TYPE AND SCREEN
ABO/RH(D): B NEG
ANTIBODY SCREEN: NEGATIVE

## 2018-06-05 SURGERY — REPAIR, HERNIA, PARAESOPHAGEAL, LAPAROSCOPIC
Anesthesia: General | Site: Abdomen

## 2018-06-05 MED ORDER — GABAPENTIN 100 MG PO CAPS
200.0000 mg | ORAL_CAPSULE | Freq: Two times a day (BID) | ORAL | Status: DC
  Administered 2018-06-05 – 2018-06-06 (×2): 200 mg via ORAL
  Filled 2018-06-05 (×4): qty 2

## 2018-06-05 MED ORDER — HYDRALAZINE HCL 20 MG/ML IJ SOLN: 10.0000 mg | INTRAMUSCULAR | Status: DC | PRN

## 2018-06-05 MED ORDER — MORPHINE SULFATE (PF) 4 MG/ML IV SOLN
1.0000 mg | INTRAVENOUS | Status: DC | PRN
  Administered 2018-06-05: 2 mg via INTRAVENOUS
  Filled 2018-06-05: qty 1

## 2018-06-05 MED ORDER — SUGAMMADEX SODIUM 500 MG/5ML IV SOLN
INTRAVENOUS | Status: AC
  Filled 2018-06-05: qty 5

## 2018-06-05 MED ORDER — SODIUM CHLORIDE (PF) 0.9 % IJ SOLN
INTRAMUSCULAR | Status: DC | PRN
  Administered 2018-06-05: 50 mL

## 2018-06-05 MED ORDER — BUPIVACAINE HCL (PF) 0.25 % IJ SOLN
INTRAMUSCULAR | Status: AC
  Filled 2018-06-05: qty 30

## 2018-06-05 MED ORDER — LIDOCAINE HCL 2 % IJ SOLN
INTRAMUSCULAR | Status: AC
  Filled 2018-06-05: qty 20

## 2018-06-05 MED ORDER — PROMETHAZINE HCL 25 MG/ML IJ SOLN: 12.5000 mg | Freq: Four times a day (QID) | INTRAMUSCULAR | Status: DC | PRN

## 2018-06-05 MED ORDER — FENTANYL CITRATE (PF) 250 MCG/5ML IJ SOLN
INTRAMUSCULAR | Status: AC
  Filled 2018-06-05: qty 5

## 2018-06-05 MED ORDER — ONDANSETRON HCL 4 MG/2ML IJ SOLN: 4.0000 mg | Freq: Once | INTRAMUSCULAR | Status: DC | PRN

## 2018-06-05 MED ORDER — PRAMIPEXOLE DIHYDROCHLORIDE 1 MG PO TABS
2.0000 mg | ORAL_TABLET | Freq: Every day | ORAL | Status: DC
  Administered 2018-06-06: 2 mg via ORAL
  Filled 2018-06-05: qty 2

## 2018-06-05 MED ORDER — CHLORHEXIDINE GLUCONATE CLOTH 2 % EX PADS: 6.0000 | MEDICATED_PAD | Freq: Once | CUTANEOUS | Status: DC

## 2018-06-05 MED ORDER — FENTANYL CITRATE (PF) 100 MCG/2ML IJ SOLN
INTRAMUSCULAR | Status: AC
  Administered 2018-06-05: 25 ug via INTRAVENOUS
  Filled 2018-06-05: qty 2

## 2018-06-05 MED ORDER — LIDOCAINE 2% (20 MG/ML) 5 ML SYRINGE
INTRAMUSCULAR | Status: DC | PRN
  Administered 2018-06-05: 100 mg via INTRAVENOUS

## 2018-06-05 MED ORDER — PHENYLEPHRINE 40 MCG/ML (10ML) SYRINGE FOR IV PUSH (FOR BLOOD PRESSURE SUPPORT)
PREFILLED_SYRINGE | INTRAVENOUS | Status: DC | PRN
  Administered 2018-06-05: 80 ug via INTRAVENOUS
  Administered 2018-06-05: 40 ug via INTRAVENOUS
  Administered 2018-06-05: 80 ug via INTRAVENOUS
  Administered 2018-06-05: 40 ug via INTRAVENOUS
  Administered 2018-06-05 (×2): 80 ug via INTRAVENOUS

## 2018-06-05 MED ORDER — TAMSULOSIN HCL 0.4 MG PO CAPS
0.4000 mg | ORAL_CAPSULE | Freq: Every day | ORAL | Status: DC
  Administered 2018-06-06: 0.4 mg via ORAL
  Filled 2018-06-05: qty 1

## 2018-06-05 MED ORDER — LOSARTAN POTASSIUM 50 MG PO TABS
100.0000 mg | ORAL_TABLET | Freq: Every day | ORAL | Status: DC
  Administered 2018-06-06: 100 mg via ORAL
  Filled 2018-06-05: qty 2

## 2018-06-05 MED ORDER — ONDANSETRON HCL 4 MG/2ML IJ SOLN
INTRAMUSCULAR | Status: DC | PRN
  Administered 2018-06-05: 4 mg via INTRAVENOUS

## 2018-06-05 MED ORDER — PHENYLEPHRINE 40 MCG/ML (10ML) SYRINGE FOR IV PUSH (FOR BLOOD PRESSURE SUPPORT)
PREFILLED_SYRINGE | INTRAVENOUS | Status: AC
  Filled 2018-06-05: qty 10

## 2018-06-05 MED ORDER — SODIUM CHLORIDE (PF) 0.9 % IJ SOLN
INTRAMUSCULAR | Status: AC
  Filled 2018-06-05: qty 50

## 2018-06-05 MED ORDER — ROCURONIUM BROMIDE 10 MG/ML (PF) SYRINGE
PREFILLED_SYRINGE | INTRAVENOUS | Status: DC | PRN
  Administered 2018-06-05 (×6): 20 mg via INTRAVENOUS
  Administered 2018-06-05: 70 mg via INTRAVENOUS
  Administered 2018-06-05: 20 mg via INTRAVENOUS

## 2018-06-05 MED ORDER — ALBUMIN HUMAN 5 % IV SOLN
INTRAVENOUS | Status: AC
  Filled 2018-06-05: qty 250

## 2018-06-05 MED ORDER — GABAPENTIN 300 MG PO CAPS
300.0000 mg | ORAL_CAPSULE | ORAL | Status: AC
  Administered 2018-06-05: 300 mg via ORAL
  Filled 2018-06-05: qty 1

## 2018-06-05 MED ORDER — KETAMINE HCL 10 MG/ML IJ SOLN
INTRAMUSCULAR | Status: DC | PRN
  Administered 2018-06-05: 40 mg via INTRAVENOUS
  Administered 2018-06-05: 20 mg via INTRAVENOUS

## 2018-06-05 MED ORDER — PROPOFOL 10 MG/ML IV BOLUS
INTRAVENOUS | Status: DC | PRN
  Administered 2018-06-05: 200 mg via INTRAVENOUS

## 2018-06-05 MED ORDER — PROPOFOL 10 MG/ML IV BOLUS
INTRAVENOUS | Status: AC
  Filled 2018-06-05: qty 20

## 2018-06-05 MED ORDER — ENSURE MAX PROTEIN PO LIQD: 2.0000 [oz_av] | ORAL | Status: DC

## 2018-06-05 MED ORDER — KETAMINE HCL 10 MG/ML IJ SOLN
INTRAMUSCULAR | Status: AC
  Filled 2018-06-05: qty 1

## 2018-06-05 MED ORDER — FENTANYL CITRATE (PF) 250 MCG/5ML IJ SOLN
INTRAMUSCULAR | Status: DC | PRN
  Administered 2018-06-05: 50 ug via INTRAVENOUS
  Administered 2018-06-05: 100 ug via INTRAVENOUS
  Administered 2018-06-05 (×2): 50 ug via INTRAVENOUS

## 2018-06-05 MED ORDER — BUPIVACAINE LIPOSOME 1.3 % IJ SUSP
INTRAMUSCULAR | Status: DC | PRN
  Administered 2018-06-05: 20 mL

## 2018-06-05 MED ORDER — SODIUM CHLORIDE 0.9 % IV SOLN
INTRAVENOUS | Status: DC | PRN
  Administered 2018-06-05: 40 ug/min via INTRAVENOUS

## 2018-06-05 MED ORDER — ONDANSETRON HCL 4 MG/2ML IJ SOLN
INTRAMUSCULAR | Status: AC
  Filled 2018-06-05: qty 2

## 2018-06-05 MED ORDER — ACETAMINOPHEN 160 MG/5ML PO SOLN
650.0000 mg | Freq: Four times a day (QID) | ORAL | Status: DC
  Administered 2018-06-05 – 2018-06-06 (×4): 650 mg via ORAL
  Filled 2018-06-05 (×4): qty 20.3

## 2018-06-05 MED ORDER — LIDOCAINE 20MG/ML (2%) 15 ML SYRINGE OPTIME
INTRAMUSCULAR | Status: DC | PRN
  Administered 2018-06-05: 1.5 mg/kg/h via INTRAVENOUS

## 2018-06-05 MED ORDER — MIDAZOLAM HCL 5 MG/5ML IJ SOLN
INTRAMUSCULAR | Status: DC | PRN
  Administered 2018-06-05: 2 mg via INTRAVENOUS

## 2018-06-05 MED ORDER — 0.9 % SODIUM CHLORIDE (POUR BTL) OPTIME
TOPICAL | Status: DC | PRN
  Administered 2018-06-05: 1000 mL

## 2018-06-05 MED ORDER — ROCURONIUM BROMIDE 100 MG/10ML IV SOLN
INTRAVENOUS | Status: AC
  Filled 2018-06-05: qty 1

## 2018-06-05 MED ORDER — SIMETHICONE 80 MG PO CHEW: 80.0000 mg | CHEWABLE_TABLET | Freq: Four times a day (QID) | ORAL | Status: DC | PRN

## 2018-06-05 MED ORDER — PHENYLEPHRINE HCL 10 MG/ML IJ SOLN
INTRAMUSCULAR | Status: AC
  Filled 2018-06-05: qty 1

## 2018-06-05 MED ORDER — HEPARIN SODIUM (PORCINE) 5000 UNIT/ML IJ SOLN
5000.0000 [IU] | Freq: Once | INTRAMUSCULAR | Status: AC
  Administered 2018-06-05: 5000 [IU] via SUBCUTANEOUS
  Filled 2018-06-05: qty 1

## 2018-06-05 MED ORDER — SCOPOLAMINE 1 MG/3DAYS TD PT72
MEDICATED_PATCH | TRANSDERMAL | Status: AC
  Filled 2018-06-05: qty 1

## 2018-06-05 MED ORDER — LIDOCAINE 2% (20 MG/ML) 5 ML SYRINGE
INTRAMUSCULAR | Status: AC
  Filled 2018-06-05: qty 5

## 2018-06-05 MED ORDER — SODIUM CHLORIDE 0.9 % IV SOLN
2.0000 g | INTRAVENOUS | Status: AC
  Administered 2018-06-05: 2 g via INTRAVENOUS
  Filled 2018-06-05: qty 2

## 2018-06-05 MED ORDER — OXYCODONE HCL 5 MG/5ML PO SOLN
5.0000 mg | ORAL | Status: DC | PRN
  Administered 2018-06-05: 5 mg via ORAL
  Filled 2018-06-05: qty 5

## 2018-06-05 MED ORDER — ACETAMINOPHEN 500 MG PO TABS
1000.0000 mg | ORAL_TABLET | ORAL | Status: AC
  Administered 2018-06-05: 1000 mg via ORAL
  Filled 2018-06-05: qty 2

## 2018-06-05 MED ORDER — ONDANSETRON HCL 4 MG/2ML IJ SOLN
4.0000 mg | Freq: Four times a day (QID) | INTRAMUSCULAR | Status: DC
  Administered 2018-06-05: 4 mg via INTRAVENOUS
  Filled 2018-06-05 (×2): qty 2

## 2018-06-05 MED ORDER — ALBUMIN HUMAN 5 % IV SOLN
INTRAVENOUS | Status: DC | PRN
  Administered 2018-06-05: 11:00:00 via INTRAVENOUS

## 2018-06-05 MED ORDER — ENALAPRILAT 1.25 MG/ML IV SOLN
1.2500 mg | Freq: Four times a day (QID) | INTRAVENOUS | Status: DC | PRN
  Filled 2018-06-05: qty 1

## 2018-06-05 MED ORDER — LACTATED RINGERS IV SOLN
INTRAVENOUS | Status: DC
  Administered 2018-06-05 (×2): via INTRAVENOUS

## 2018-06-05 MED ORDER — KCL IN DEXTROSE-NACL 20-5-0.45 MEQ/L-%-% IV SOLN
INTRAVENOUS | Status: DC
  Administered 2018-06-05 – 2018-06-06 (×3): via INTRAVENOUS
  Filled 2018-06-05 (×3): qty 1000

## 2018-06-05 MED ORDER — KETOROLAC TROMETHAMINE 15 MG/ML IJ SOLN
15.0000 mg | Freq: Three times a day (TID) | INTRAMUSCULAR | Status: DC
  Administered 2018-06-05 – 2018-06-06 (×3): 15 mg via INTRAVENOUS
  Filled 2018-06-05 (×3): qty 1

## 2018-06-05 MED ORDER — SCOPOLAMINE 1 MG/3DAYS TD PT72
MEDICATED_PATCH | TRANSDERMAL | Status: DC | PRN
  Administered 2018-06-05: 1 via TRANSDERMAL

## 2018-06-05 MED ORDER — LACTATED RINGERS IR SOLN
Status: DC | PRN
  Administered 2018-06-05: 1000 mL

## 2018-06-05 MED ORDER — PANTOPRAZOLE SODIUM 40 MG IV SOLR
40.0000 mg | Freq: Every day | INTRAVENOUS | Status: DC
  Administered 2018-06-05: 40 mg via INTRAVENOUS
  Filled 2018-06-05: qty 40

## 2018-06-05 MED ORDER — SUGAMMADEX SODIUM 200 MG/2ML IV SOLN
INTRAVENOUS | Status: DC | PRN
  Administered 2018-06-05: 500 mg via INTRAVENOUS

## 2018-06-05 MED ORDER — DIPHENHYDRAMINE HCL 50 MG/ML IJ SOLN: 12.5000 mg | Freq: Three times a day (TID) | INTRAMUSCULAR | Status: DC | PRN

## 2018-06-05 MED ORDER — DEXAMETHASONE SODIUM PHOSPHATE 10 MG/ML IJ SOLN
INTRAMUSCULAR | Status: DC | PRN
  Administered 2018-06-05: 10 mg via INTRAVENOUS

## 2018-06-05 MED ORDER — MIDAZOLAM HCL 2 MG/2ML IJ SOLN
INTRAMUSCULAR | Status: AC
  Filled 2018-06-05: qty 2

## 2018-06-05 MED ORDER — DEXAMETHASONE SODIUM PHOSPHATE 10 MG/ML IJ SOLN
INTRAMUSCULAR | Status: AC
  Filled 2018-06-05: qty 1

## 2018-06-05 MED ORDER — AZELASTINE HCL 0.1 % NA SOLN
1.0000 | Freq: Two times a day (BID) | NASAL | Status: DC
  Administered 2018-06-05 – 2018-06-06 (×2): 1 via NASAL
  Filled 2018-06-05: qty 30

## 2018-06-05 MED ORDER — FENTANYL CITRATE (PF) 100 MCG/2ML IJ SOLN
25.0000 ug | INTRAMUSCULAR | Status: DC | PRN
  Administered 2018-06-05 (×4): 25 ug via INTRAVENOUS

## 2018-06-05 MED ORDER — ENOXAPARIN SODIUM 30 MG/0.3ML ~~LOC~~ SOLN
30.0000 mg | Freq: Two times a day (BID) | SUBCUTANEOUS | Status: DC
  Administered 2018-06-05 – 2018-06-06 (×2): 30 mg via SUBCUTANEOUS
  Filled 2018-06-05 (×2): qty 0.3

## 2018-06-05 SURGICAL SUPPLY — 57 items
APPLIER CLIP ROT 10 11.4 M/L (STAPLE)
BANDAGE ADH SHEER 1  50/CT (GAUZE/BANDAGES/DRESSINGS) ×12 IMPLANT
BENZOIN TINCTURE PRP APPL 2/3 (GAUZE/BANDAGES/DRESSINGS) ×2 IMPLANT
CABLE HIGH FREQUENCY MONO STRZ (ELECTRODE) IMPLANT
CLIP APPLIE ROT 10 11.4 M/L (STAPLE) IMPLANT
COVER WAND RF STERILE (DRAPES) ×2 IMPLANT
DECANTER SPIKE VIAL GLASS SM (MISCELLANEOUS) ×2 IMPLANT
DEVICE SUT QUICK LOAD TK 5 (STAPLE) ×18 IMPLANT
DEVICE SUT TI-KNOT TK 5X26 (MISCELLANEOUS) ×2 IMPLANT
DEVICE SUTURE ENDOST 10MM (ENDOMECHANICALS) ×2 IMPLANT
DISSECTOR BLUNT TIP ENDO 5MM (MISCELLANEOUS) ×2 IMPLANT
DRAIN PENROSE 18X1/2 LTX STRL (DRAIN) ×2 IMPLANT
ELECT L-HOOK LAP 45CM DISP (ELECTROSURGICAL) ×2
ELECT PENCIL ROCKER SW 15FT (MISCELLANEOUS) ×2 IMPLANT
ELECT REM PT RETURN 15FT ADLT (MISCELLANEOUS) ×2 IMPLANT
ELECTRODE L-HOOK LAP 45CM DISP (ELECTROSURGICAL) ×1 IMPLANT
GLOVE BIO SURGEON STRL SZ7.5 (GLOVE) ×2 IMPLANT
GLOVE BIOGEL PI IND STRL 6.5 (GLOVE) ×1 IMPLANT
GLOVE BIOGEL PI IND STRL 7.0 (GLOVE) ×1 IMPLANT
GLOVE BIOGEL PI IND STRL 7.5 (GLOVE) ×1 IMPLANT
GLOVE BIOGEL PI INDICATOR 6.5 (GLOVE) ×1
GLOVE BIOGEL PI INDICATOR 7.0 (GLOVE) ×1
GLOVE BIOGEL PI INDICATOR 7.5 (GLOVE) ×1
GLOVE INDICATOR 8.0 STRL GRN (GLOVE) ×2 IMPLANT
GLOVE SURG SS PI 6.5 STRL IVOR (GLOVE) ×2 IMPLANT
GLOVE SURG SS PI 7.0 STRL IVOR (GLOVE) ×4 IMPLANT
GOWN STRL REUS W/TWL LRG LVL3 (GOWN DISPOSABLE) ×2 IMPLANT
GOWN STRL REUS W/TWL XL LVL3 (GOWN DISPOSABLE) ×6 IMPLANT
KIT BASIN OR (CUSTOM PROCEDURE TRAY) ×2 IMPLANT
KIT GASTRIC LAVAGE 34FR ADT (SET/KITS/TRAYS/PACK) ×2 IMPLANT
PACK UNIVERSAL I (CUSTOM PROCEDURE TRAY) ×2 IMPLANT
PAD POSITIONING PINK XL (MISCELLANEOUS) ×2 IMPLANT
POUCH RETRIEVAL ECOSAC 10 (ENDOMECHANICALS) IMPLANT
POUCH RETRIEVAL ECOSAC 10MM (ENDOMECHANICALS)
SCISSORS LAP 5X45 EPIX DISP (ENDOMECHANICALS) ×2 IMPLANT
SET IRRIG TUBING LAPAROSCOPIC (IRRIGATION / IRRIGATOR) ×2 IMPLANT
SET TUBE SMOKE EVAC HIGH FLOW (TUBING) ×2 IMPLANT
SHEARS HARMONIC ACE PLUS 36CM (ENDOMECHANICALS) ×2 IMPLANT
SHEARS HARMONIC ACE PLUS 45CM (MISCELLANEOUS) ×2 IMPLANT
SLEEVE XCEL OPT CAN 5 100 (ENDOMECHANICALS) ×6 IMPLANT
STRIP CLOSURE SKIN 1/2X4 (GAUZE/BANDAGES/DRESSINGS) ×2 IMPLANT
SUT ETHIBOND 2 0 SH (SUTURE) ×3
SUT ETHIBOND 2 0 SH 36X2 (SUTURE) ×3 IMPLANT
SUT MNCRL AB 4-0 PS2 18 (SUTURE) ×4 IMPLANT
SUT SURGIDAC NAB ES-9 0 48 120 (SUTURE) ×12 IMPLANT
SUT VIC AB 4-0 SH 18 (SUTURE) IMPLANT
TAPE STRIPS DRAPE STRL (GAUZE/BANDAGES/DRESSINGS) ×2 IMPLANT
TIP INNERVISION DETACH 40FR (MISCELLANEOUS) IMPLANT
TIP INNERVISION DETACH 50FR (MISCELLANEOUS) IMPLANT
TIP INNERVISION DETACH 56FR (MISCELLANEOUS) IMPLANT
TIPS INNERVISION DETACH 40FR (MISCELLANEOUS)
TOWEL OR NON WOVEN STRL DISP B (DISPOSABLE) IMPLANT
TRAY FOLEY MTR SLVR 16FR STAT (SET/KITS/TRAYS/PACK) ×2 IMPLANT
TRAY LAPAROSCOPIC (CUSTOM PROCEDURE TRAY) ×2 IMPLANT
TROCAR BLADELESS OPT 5 100 (ENDOMECHANICALS) ×2 IMPLANT
TROCAR XCEL BLUNT TIP 100MML (ENDOMECHANICALS) IMPLANT
TROCAR XCEL NON-BLD 11X100MML (ENDOMECHANICALS) ×2 IMPLANT

## 2018-06-05 NOTE — Op Note (Signed)
06/05/2018  12:40 PM  PATIENT:  Chase Ramos  63 y.o. male  PRE-OPERATIVE DIAGNOSIS:  paraesophageal hernia with reflux  POST-OPERATIVE DIAGNOSIS:  Same  PROCEDURE:  Procedure(s): LAPAROSCOPIC PARAESOPHAGEAL HERNIA REPAIR WITH  NISSEN FUNDOPLICATION,  ERAS PATHWAY  SURGEON:  Surgeon(s): Greer Pickerel, MD   ASSISTANTS: Alphonsa Overall, MD   ANESTHESIA:   general  DRAINS: none   LOCAL MEDICATIONS USED:  MARCAINE    and OTHER Exaprel in a TAP block  SPECIMEN:  Source of Specimen:  hernia sac  DISPOSITION OF SPECIMEN:  discarded  COUNTS:  YES  INDICATION FOR PROCEDURE: 63 year old gentleman presented for elective repair of a large paraesophageal hiatal hernia with reflux.  He has a longstanding history of hiatal hernia.  He is progressively getting more symptomatic.  He has early satiety.  He has heartburn.  He has epigastric and substernal discomfort with eating.  He has now vomiting more frequently.  Upper GI, CT imaging and upper endoscopy were performed preoperatively.  Please see outside records regarding these results.  Please see preoperative note regarding discussion of risk and benefits of the procedure  PROCEDURE: Patient was given oral Tylenol and gabapentin preoperatively.  Patient was given 5000 units of subcutaneous heparin preoperatively for DVT prophylaxis.  After obtaining informed consent, he was taken to operating room 1 at [facility] placed supine on the operating room table.  General endotracheal anesthesia was established.  Sequential compression devices were placed.  A Foley catheter was placed.  His abdomen was prepped and draped in usual standard surgical fashion with ChloraPrep.  Surgical timeout was performed.  Access to the abdomen was done via Optiview technique in the left upper quadrant.  A small incision was made in the left upper quadrant just below the subcostal margin.  Then using a 0 degree 5 mm laparoscope I advanced it through all layers  of the abdominal wall and entered the abdominal cavity.  Pneumoperitoneum was smoothly established up to a patient pressure of 15 mmHg without any change in patient vitals.  Patient was placed in extreme reverse Trendelenburg.  5 mm trocar was placed about 4 cm above into the left of the umbilicus.  A 5 mm trocar was placed in the lateral right abdomen.  A 11 mm trocar was placed in the mid right abdomen.  And a left lateral 5 mm trocar was placed in the upper left abdomen laterally.  A combination of Exparel Marcaine was infiltrated in a regional fashion in bilateral lateral abdominal walls as a tap block.  We then placed a Nathanson liver retractor through the subxiphoid incision.  The left lobe of the liver was retracted.  This revealed a hiatal hernia with a significant portion of the stomach herniated probably more than 80% into the chest.  This is consistent with a type III hernia.  Using gentle traction we were able to reduce the entire stomach out of the mediastinum.  We grasped the hernia sac just beyond the diaphragmatic crura at the 12 o'clock position& then, we entered the sac using ultrasonic dissection with the harmonic scalpel. This allowed access to the areolar attachments of the hernia sac to the mediastinal structures. The mediastinal dissection proceeded with sharp ultrasonic dissection. We used some blunt dissection with a Kittner .  We took down the gastrohepatic ligament with harmonic scalpel.  We identified the right crus of the diaphragm.  I incised it just medial to the peritoneum over the right crus of the diaphragm with harmonic scalpel.  We then carried the dissection up toward the anterior part of the hernia sac at the 12 o'clock position.  In order to facilitate ongoing mediastinal dissection I felt we needed to get a Penrose around the retroesophageal space.  I identified the greater curvature of her stomach and took down short gastric vessels up to the left crus of the diaphragm  with harmonic scalpel.  We continue to mobilize and reduce the hernia sac along the left side as well.  We continue to mobilize the entire hernia sac.  More of the hernia sac was located anterior laterally to the left.  The parietal pleura was not violated.  I was able to reduce the hernia sac in its entirety.  It took approximately 2 hours to reduce the hernia sac in its entirety.  We had identified the posterior vagus nerve and it was protected.  After reducing the hernia sac, we separated the hernia sac from the crura, taking care to leave the crural peritoneal lining intact.  The hernia sac was excised and brought out through the trocar and discarded.    At this point I was able to completely identify the junction of the left and right crura of the diaphragm.  I was able to place a Penrose drain behind the esophagus therefore my partner could not lift up the esophagus.  We then continue to mobilize the esophagus in the mediastinum taking down the avascular areolear  tissue with harmonic scalpel ensuring that the harmonic was not touching the esophagus.  We had visualized the aorta and stayed away from that structure.  At this point I felt the diet achieved enough intra-abdominal esophageal length.  There was approximately 3-1/2 to 4 cm in the abdomen.  I then mobilized the esophageal fat pad and some of the remaining hernia sac off of the proximal stomach using harmonic scalpel and this was discarded.  This allowed [place] to visualize the gastroesophageal junction which was approximately 3-1/2 cm from the diaphragm  At this point I decided to proceed with closure of the diaphragmatic crura.  The diaphragm musculature appeared healthy and intact along with its overlying peritoneum.  5 interrupted 0 Ethibond Endo Stitches were used to close the diaphragmatic defect posterior laterally.  Each suture was secured with a titanium tie knot.  At this point we had the anesthetist readvanced a 59 French tapered  dilator back down the esophagus into the stomach.  We visualized him passing it.  I ended up placing an additional 1 interrupted Ethibond suture at the diaphragmatic crura (for a total of 6 sutures)  It appeared adequate closure.    At this point we then performed a loose Nissen fundoplication.   An atraumatic instrument was then passed through the retroesophageal window. The line of the short gastric arteries was grasped at the lateral aspect of the gastric cardia and pulled through the retroesophageal window.  The wrap remained in place after releasing the grasper on the stomach.  We then performed a shoeshine maneuver.  It appeared that we had the correct orientation of the wrap.  Then using a 2-0 Ethibond on a free needle I placed 3 interrupted sutures incorporating both sides of the wrap along with a bite of the esophagus.  Each suture was secured with a titanium tie knot.  I was able to place a atraumatic grasper between the wrap and the esophagus demonstrating that it was floppy.  The dilator was removed without any evidence of injury to the dilator.  Remaining  local was infiltrated.  The liver retractor was removed.  Trochars were removed.  Skin was closed with 4-0 Monocryl in a subcuticular fashion followed by the application of benzoin, Steri-Strips and bandages.  All needle instrument counts were correct x2 there were no immediate complications patient was taken to the recovery room in stable condition     PLAN OF CARE: Admit for overnight observation  PATIENT DISPOSITION:  PACU - hemodynamically stable.   Delay start of Pharmacological VTE agent (>24hrs) due to surgical blood loss or risk of bleeding:  no  Leighton Ruff. Redmond Pulling, MD, FACS General, Bariatric, & Minimally Invasive Surgery [facility], [place]

## 2018-06-05 NOTE — Anesthesia Postprocedure Evaluation (Signed)
Anesthesia Post Note  Patient: Chase Ramos  Procedure(s) Performed: LAPAROSCOPIC PARAESOPHAGEAL HERNIA REPAIR WITH  NISSEN FUNDOPLICATION,  ERAS PATHWAY (N/A Abdomen)     Patient location during evaluation: PACU Anesthesia Type: General Level of consciousness: awake and alert Pain management: pain level controlled Vital Signs Assessment: post-procedure vital signs reviewed and stable Respiratory status: spontaneous breathing, nonlabored ventilation, respiratory function stable and patient connected to nasal cannula oxygen Cardiovascular status: blood pressure returned to baseline and stable Postop Assessment: no apparent nausea or vomiting Anesthetic complications: no    Last Vitals:  Vitals:   06/05/18 1448 06/05/18 1543  BP: (!) 160/90 (!) 150/97  Pulse: 87 88  Resp: 18 18  Temp: 36.5 C 36.8 C  SpO2: 98% 98%    Last Pain:  Vitals:   06/05/18 1543  TempSrc: Oral  PainSc:                  Tiajuana Amass

## 2018-06-05 NOTE — H&P (Signed)
Vraj Denardo is an 63 y.o. male.   Chief Complaint: here for surgery HPI: 63 yo WM with large paraesophageal hernia presents for surgery.   He has become more symptomatic since seen in December. Now can only tolerate very small meals without having emesis. O/w no medical changes.    History of Present Illness Randall Hiss M. Atilla Zollner MD; 03/21/2018 9:50 AM) The patient is a 63 year old male who presents for evaluation of reflux esophagitis. He is referred by Dr Louis Meckel and Dr Bryan Lemma for evaluation of a paraesophageal hernia with reflux. He states that he was first told that he had a hiatal hernia at age 67. A few years ago when seeing his chiropractor and had a chest x-ray he was told that he had a hiatal herniorrhaphy can. He has a long-standing history of reflux and heartburn and takes Nexium on a daily basis. For a while he took Tums on a daily basis. He reports that he had a diagnosis of Barrett's esophagus a few years ago on upper endoscopy. He states his symptoms were manageable until a few months ago. About 4 months ago he started having what he describes as bad gas, bad smelling burps, and nonstop diarrhea. He did travel out of the country in August. But otherwise no medication changes or other changes. He was on meloxicam for short time. He continues to have anywhere from 10-20 loose stools per day. He will have them even in the middle the night. They range from pudding consistencies to brown water. He saw gastroenterology and had stool studies and stool pathogen all of which were negative. He underwent a colonoscopy a few weeks ago which showed diverticulosis a benign adenoma. Random biopsies showed no evidence of colitis. He also reports chest tightness and fullness with eating. He reports more shortness of breath over the past few months. He states he can only eat small amounts before getting epigastric pain. He states that if he eats too rapidly or eats too much he will vomit.  He underwent an upper endoscopy which showed endoscopic findings concerning for Barrett's esophagus, type III paraesophageal hiatal hernia, proximal extent of gastric folds at 35 cm. Mild erythematous mucosa in the gastric fundus. Site of hiatal hernia narrowing at 44 cm and a 9 cm long hernia. Z line at 33 cm and GE junction at 35 cm. His biopsies from his upper endoscopy were essentially all negative. He had a negative small bowel biopsy. He had mild chronic gastritis. No H. pylori. The esophageal biopsies from 2 locations were negative except for reflux changes. He does use CPAP. He was treated for hepatitis C many years ago. He takes medication for hypertension. He does have bad BPH. He denies any prior abdominal surgery.    Past Medical History:  Diagnosis Date  . Allergy   . Anemia   . Anxiety   . Arthritis   . Cancer ([place])    melanoma skin   . Depression   . GERD (gastroesophageal reflux disease)   . Hepatitis C   . HTN (hypertension)   . Hyperlipidemia   . Paraesophageal hernia   . Pre-diabetes   . Sleep apnea     Past Surgical History:  Procedure Laterality Date  . SHOULDER ACROMIOPLASTY Right age 72    Family History  Problem Relation Age of Onset  . Esophageal cancer Maternal Grandfather   . Colon cancer Neg Hx   . Rectal cancer Neg Hx   . Stomach cancer Neg Hx  Social History:  reports that he has never smoked. He has never used smokeless tobacco. He reports current alcohol use. He reports that he does not use drugs.  Allergies: No Known Allergies  Medications Prior to Admission  Medication Sig Dispense Refill  . azelastine (ASTELIN) 0.1 % nasal spray Place 1 spray into both nostrils 2 (two) times daily. Use in each nostril as directed     . dicyclomine (BENTYL) 20 MG tablet Take 20 mg by mouth 3 (three) times daily.    Marland Kitchen esomeprazole (NEXIUM) 20 MG capsule Take 20 mg by mouth daily.     . Eszopiclone 3 MG TABS Take 3 mg by mouth at bedtime. Take  immediately before bedtime     . finasteride (PROSCAR) 5 MG tablet Take 5 mg by mouth daily.    . fluticasone (FLONASE) 50 MCG/ACT nasal spray Place 1 spray into both nostrils 2 (two) times daily.     . hydrochlorothiazide (HYDRODIURIL) 25 MG tablet Take 25 mg by mouth daily.    Marland Kitchen losartan (COZAAR) 100 MG tablet Take 100 mg by mouth daily.    . meloxicam (MOBIC) 15 MG tablet Take 15 mg by mouth daily.    . montelukast (SINGULAIR) 10 MG tablet Take 10 mg by mouth daily.     . pramipexole (MIRAPEX) 1 MG tablet Take 2 mg by mouth daily.     . silodosin (RAPAFLO) 8 MG CAPS capsule Take 8 mg by mouth daily with breakfast.      No results found for this or any previous visit (from the past 48 hour(s)). No results found.  Review of Systems  Constitutional: Negative for weight loss.  HENT: Negative for nosebleeds.   Eyes: Negative for blurred vision.  Respiratory: Negative for shortness of breath.   Cardiovascular: Negative for chest pain, palpitations, orthopnea and PND.       Denies DOE  Gastrointestinal: Positive for nausea and vomiting.  Genitourinary: Negative for dysuria and hematuria.  Musculoskeletal: Negative.   Skin: Negative for itching and rash.  Neurological: Negative for dizziness, focal weakness, seizures, loss of consciousness and headaches.       Denies TIAs, amaurosis fugax  Endo/Heme/Allergies: Does not bruise/bleed easily.  Psychiatric/Behavioral: The patient is not nervous/anxious.     Blood pressure (!) 132/94, pulse 81, temperature (!) 97.3 F (36.3 C), resp. rate 14, height 6' (1.829 m), weight 117.9 kg, SpO2 98 %. Physical Exam  Vitals reviewed. Constitutional: He is oriented to person, place, and time. He appears well-developed and well-nourished. No distress.  HENT:  Head: Normocephalic and atraumatic.  Right Ear: External ear normal.  Left Ear: External ear normal.  Eyes: Conjunctivae are normal. No scleral icterus.  Neck: Normal range of motion. Neck  supple. No tracheal deviation present. No thyromegaly present.  Cardiovascular: Normal rate and normal heart sounds.  Respiratory: Effort normal and breath sounds normal. No stridor. No respiratory distress. He has no wheezes.  GI: Soft. He exhibits no distension. There is no abdominal tenderness. There is no rebound.  Musculoskeletal:        General: No tenderness or edema.  Lymphadenopathy:    He has no cervical adenopathy.  Neurological: He is alert and oriented to person, place, and time. He exhibits normal muscle tone.  Skin: Skin is warm and dry. No rash noted. He is not diaphoretic. No erythema. No pallor.  Psychiatric: He has a normal mood and affect. His behavior is normal. Judgment and thought content normal.  Assessment/Plan  GASTROESOPHAGEAL REFLUX DISEASE CONCURRENT WITH AND DUE TO PARAESOPHAGEAL HERNIA (K21.9) Impression: It appears he has a large paraesophageal hiatal hernia with reflux. It appears he is also symptomatic with respect to having nausea, belching, postprandial fullness. We discussed paraesophageal hiatal hernias. He was given Neurosurgeon. I advised him that we needed to do an additional imaging study-upper GI. We explained that information this would give [place]. As long as there is nothing unexpected on the upper GI I recommended laparoscopic paraesophageal hernia repair with partial fundoplication since he has reflux. We discussed surgery in detail including risk and benefits and long-term outcomes as well as complications. We discussed the risk of bleeding, infection, injury to surrounding structures, PTX esophageal injury, blood clot formation, perioperative cardiac and pulmonary events, failure to ameliorate his reflux, gas bloat, dysphagia, inability to burp, bloating, recurrence, and failure to ameliorate his diarrhea. We discussed possible mesh implantation around the diaphragmatic repair-we discussed that this would likely be an intraoperative decision.  We discussed the postoperative diet plan in detail. We discussed he may need to stay on liquids or soft diet for an extended period time. We discussed the typical hospitalization. It's not entirely clear the etiology of his loose stool. It is possible that his aerophagia could be the source but it may not be related to his paraesophageal hernia. We will get the upper GI and I'll call him with results and we will go from there Current Plans Pt Education - Pamphlet Given - Gastroesophagel Reflux Disease: discussed with patient and provided information. Pt Education - [facility] Free Text Education/Instructions: discussed with patient and provided information. BPH ASSOCIATED WITH NOCTURIA (N40.1) ESSENTIAL HYPERTENSION (I10) SLEEP APNEA IN ADULT (G47.30) OBESITY (BMI 30-39.9) (E66.9) Impression: We did talk about risk of recurrence with respect to hiatal hernia recurrence. We discussed that obesity can increase risk of recurrence. While his weight right now is currently not prohibitive it would be ideal if he could work on weight loss.   Leighton Ruff. Redmond Pulling, MD, FACS General, Bariatric, & Minimally Invasive Surgery [facility], [place]  Greer Pickerel, MD 06/05/2018, 7:16 AM

## 2018-06-05 NOTE — Anesthesia Procedure Notes (Signed)
Procedure Name: Intubation Date/Time: 06/05/2018 7:40 AM Performed by: Talbot Grumbling, CRNA Pre-anesthesia Checklist: Patient identified, Emergency Drugs available, Suction available and Patient being monitored Patient Re-evaluated:Patient Re-evaluated prior to induction Oxygen Delivery Method: Circle system utilized Preoxygenation: Pre-oxygenation with 100% oxygen Induction Type: IV induction Ventilation: Mask ventilation without difficulty Laryngoscope Size: Mac and 3 Grade View: Grade I Tube type: Oral Tube size: 8.0 mm Number of attempts: 1 Airway Equipment and Method: Stylet Placement Confirmation: ETT inserted through vocal cords under direct vision,  positive ETCO2 and breath sounds checked- equal and bilateral Secured at: 23 cm Tube secured with: Tape Dental Injury: Teeth and Oropharynx as per pre-operative assessment

## 2018-06-05 NOTE — Transfer of Care (Signed)
Immediate Anesthesia Transfer of Care Note  Patient: Chase Ramos  Procedure(s) Performed: LAPAROSCOPIC PARAESOPHAGEAL HERNIA REPAIR WITH  NISSEN FUNDOPLICATION,  ERAS PATHWAY (N/A Abdomen)  Patient Location: PACU  Anesthesia Type:General  Level of Consciousness: awake, alert  and oriented  Airway & Oxygen Therapy: Patient Spontanous Breathing and Patient connected to face mask oxygen  Post-op Assessment: Report given to RN and Post -op Vital signs reviewed and stable  Post vital signs: Reviewed and stable  Last Vitals:  Vitals Value Taken Time  BP 147/90 06/05/2018 12:16 PM  Temp    Pulse 92 06/05/2018 12:18 PM  Resp 17 06/05/2018 12:18 PM  SpO2 100 % 06/05/2018 12:18 PM  Vitals shown include unvalidated device data.  Last Pain: There were no vitals filed for this visit.    Patients Stated Pain Goal: 3 (54/27/06 2376)  Complications: No apparent anesthesia complications

## 2018-06-06 ENCOUNTER — Observation Stay (HOSPITAL_COMMUNITY): Payer: BLUE CROSS/BLUE SHIELD

## 2018-06-06 DIAGNOSIS — K449 Diaphragmatic hernia without obstruction or gangrene: Secondary | ICD-10-CM | POA: Diagnosis not present

## 2018-06-06 LAB — COMPREHENSIVE METABOLIC PANEL
ALT: 61 U/L — ABNORMAL HIGH (ref 0–44)
AST: 53 U/L — ABNORMAL HIGH (ref 15–41)
Albumin: 4.1 g/dL (ref 3.5–5.0)
Alkaline Phosphatase: 39 U/L (ref 38–126)
Anion gap: 8 (ref 5–15)
BUN: 20 mg/dL (ref 6–20)
CHLORIDE: 103 mmol/L (ref 98–111)
CO2: 23 mmol/L (ref 22–32)
CREATININE: 1.23 mg/dL (ref 0.61–1.24)
Calcium: 8.3 mg/dL — ABNORMAL LOW (ref 8.9–10.3)
GFR calc Af Amer: >60 mL/min (ref >60)
Glucose, Bld: 132 mg/dL — ABNORMAL HIGH (ref 70–99)
Potassium: 4 mmol/L (ref 3.5–5.1)
Sodium: 134 mmol/L — ABNORMAL LOW (ref 135–145)
Total Bilirubin: 0.5 mg/dL (ref 0.3–1.2)
Total Protein: 6.9 g/dL (ref 6.5–8.1)

## 2018-06-06 LAB — CBC WITH DIFFERENTIAL/PLATELET
Abs Immature Granulocytes: 0.14 10*3/uL — ABNORMAL HIGH (ref 0.00–0.07)
Basophils Absolute: 0 10*3/uL (ref 0.0–0.1)
Basophils Relative: 0 %
Eosinophils Absolute: 0 10*3/uL (ref 0.0–0.5)
Eosinophils Relative: 0 %
HCT: 38.7 % — ABNORMAL LOW (ref 39.0–52.0)
Hemoglobin: 11.7 g/dL — ABNORMAL LOW (ref 13.0–17.0)
IMMATURE GRANULOCYTES: 1 %
Lymphocytes Relative: 10 %
Lymphs Abs: 1.5 10*3/uL (ref 0.7–4.0)
MCH: 24.1 pg — ABNORMAL LOW (ref 26.0–34.0)
MCHC: 30.2 g/dL (ref 30.0–36.0)
MCV: 79.8 fL — AB (ref 80.0–100.0)
Monocytes Absolute: 1.3 10*3/uL — ABNORMAL HIGH (ref 0.1–1.0)
Monocytes Relative: 9 %
NEUTROS PCT: 80 %
Neutro Abs: 11.6 10*3/uL — ABNORMAL HIGH (ref 1.7–7.7)
PLATELETS: 234 10*3/uL (ref 150–400)
RBC: 4.85 MIL/uL (ref 4.22–5.81)
RDW: 16.3 % — AB (ref 11.5–15.5)
WBC: 14.6 10*3/uL — ABNORMAL HIGH (ref 4.0–10.5)
nRBC: 0 % (ref 0.0–0.2)

## 2018-06-06 MED ORDER — ACETAMINOPHEN 500 MG PO TABS: 1000.0000 mg | ORAL_TABLET | Freq: Four times a day (QID) | ORAL | 0 refills | Status: AC

## 2018-06-06 MED ORDER — ONDANSETRON HCL 4 MG PO TABS: 4.0000 mg | ORAL_TABLET | Freq: Three times a day (TID) | ORAL | 0 refills | Status: AC | PRN

## 2018-06-06 MED ORDER — OXYCODONE HCL 5 MG PO TABS: 5.0000 mg | ORAL_TABLET | Freq: Four times a day (QID) | ORAL | 0 refills | Status: AC | PRN

## 2018-06-06 MED ORDER — IOPAMIDOL (ISOVUE-300) INJECTION 61%
INTRAVENOUS | Status: AC
  Administered 2018-06-06: 50 mL via ORAL
  Filled 2018-06-06: qty 50

## 2018-06-06 NOTE — Discharge Summary (Signed)
Physician Discharge Summary  Jp Eastham [id number]:[id number] DOB: Nov 20, 1955 DOA: 06/05/2018  PCP: Robert Bellow, PA-C  Admit date: 06/05/2018 Discharge date: 06/06/2018  Recommendations for Outpatient Follow-up:    Follow-up Information    Greer Pickerel, MD. Go on 06/27/2018.   Specialty:  General Surgery Why:  at 8:30 AM, pls arrive at 8:15 AM Contact information: [address] [place] [address] [phone]          Discharge Diagnoses:  1. Hypertension 2. H/o Hep C 3. OSA on CPAP 4. Large paraesophageal hiatal hernia with reflux s/p repair  Surgical Procedure: LAPAROSCOPIC PARAESOPHAGEAL REPAIR WITH NISSEN FUNDOPLICATION  Discharge Condition: GOOD Disposition: HOME  Diet recommendation: FULL LIQUID DIET X 2 WEEKS THEN SOFT DIET X 2 WEEKS  Filed Weights   06/05/18 0604  Weight: 117.9 kg    History of present illness:  63 yo WM with large paraesophageal hernia presents for surgery.   He has become more symptomatic since seen in December. Now can only tolerate very small meals without having emesis. O/w no medical changes.  Hospital Course:  He came in for planned repair of his large paraesophageal hiatal hernia.  EraS protocol was used.  He was started on clear liquids the afternoon of surgery which he tolerated.  He was placed on scheduled Tylenol, gabapentin and Toradol postoperatively.  He received subcutaneous heparin preoperatively and was continued on Lovenox postoperatively for DVT prophylaxis.  On postoperative day 1.  He was doing well.  He had a bump in his white blood cell count but he had no fever or tachycardia and looked great.  He was tolerating clear liquids.  We did get an upper GI just to document his anatomy for future reference.  There was no evidence of a leak.  He was advanced to a full liquid diet which he tolerated.  I have verbally discussed discharge instructions with the patient.  He is to stay on full liquids and clear  liquids for the next 2 weeks and then he can progress to soft diet for 2 weeks.    Discharge Instructions  Discharge Instructions    Call MD for:   Complete by:  As directed    Temperature >101   Call MD for:  hives   Complete by:  As directed    Call MD for:  persistant dizziness or light-headedness   Complete by:  As directed    Call MD for:  persistant nausea and vomiting   Complete by:  As directed    Call MD for:  redness, tenderness, or signs of infection (pain, swelling, redness, odor or green/yellow discharge around incision site)   Complete by:  As directed    Call MD for:  severe uncontrolled pain   Complete by:  As directed    Diet general   Complete by:  As directed    Full liquid diet   Discharge instructions   Complete by:  As directed    See CCS discharge instructions   Increase activity slowly   Complete by:  As directed      Allergies as of 06/06/2018   No Known Allergies     Medication List    TAKE these medications   acetaminophen 500 MG tablet Commonly known as:  TYLENOL Take 2 tablets (1,000 mg total) by mouth every 6 (six) hours for 3 days.   azelastine 0.1 % nasal spray Commonly known as:  ASTELIN Place 1 spray into both nostrils 2 (  two) times daily. Use in each nostril as directed   dicyclomine 20 MG tablet Commonly known as:  BENTYL Take 20 mg by mouth 3 (three) times daily.   esomeprazole 20 MG capsule Commonly known as:  NEXIUM Take 20 mg by mouth daily.   Eszopiclone 3 MG Tabs Take 3 mg by mouth at bedtime. Take immediately before bedtime   finasteride 5 MG tablet Commonly known as:  PROSCAR Take 5 mg by mouth daily.   fluticasone 50 MCG/ACT nasal spray Commonly known as:  FLONASE Place 1 spray into both nostrils 2 (two) times daily.   hydrochlorothiazide 25 MG tablet Commonly known as:  HYDRODIURIL Take 25 mg by mouth daily.   losartan 100 MG tablet Commonly known as:  COZAAR Take 100 mg by mouth daily.   meloxicam 15  MG tablet Commonly known as:  MOBIC Take 15 mg by mouth daily.   montelukast 10 MG tablet Commonly known as:  SINGULAIR Take 10 mg by mouth daily.   ondansetron 4 MG tablet Commonly known as:  ZOFRAN Take 1 tablet (4 mg total) by mouth every 8 (eight) hours as needed for nausea or vomiting.   oxyCODONE 5 MG immediate release tablet Commonly known as:  Oxy IR/ROXICODONE Take 1 tablet (5 mg total) by mouth every 6 (six) hours as needed for severe pain.   pramipexole 1 MG tablet Commonly known as:  MIRAPEX Take 2 mg by mouth daily.   silodosin 8 MG Caps capsule Commonly known as:  RAPAFLO Take 8 mg by mouth daily with breakfast.      Follow-up Information    Greer Pickerel, MD. Go on 06/27/2018.   Specialty:  General Surgery Why:  at 8:30 AM, pls arrive at 8:15 AM Contact information: [place] [address] [phone]            The results of significant diagnostics from this hospitalization (including imaging, microbiology, ancillary and laboratory) are listed below for reference.    Significant Diagnostic Studies: Dg Esophagus W Single Cm (sol Or Thin Ba)  Result Date: 06/06/2018 CLINICAL DATA:  63 year old male inpatient status post paraesophageal hernia repair 1 day prior presenting for routine postoperative evaluation. EXAM: ESOPHOGRAM/BARIUM SWALLOW TECHNIQUE: Single contrast examination was performed using water soluble oral contrast. FLUOROSCOPY TIME:  Fluoroscopy Time:  0 minutes 59 seconds Radiation Exposure Index (if provided by the fluoroscopic device): 30.3 mGy Number of Acquired Spot Images: 3 COMPARISON:  04/25/2018 CT abdomen/pelvis. FINDINGS: Water-soluble contrast transits the grossly normal esophagus into the stomach and duodenum without delay. There has been interval repair of the large paraesophageal hiatal hernia. Expected fundoplication defect is seen in the proximal stomach. No evidence of leak or stricture. IMPRESSION:  Satisfactory postoperative appearance status post repair of paraesophageal hiatal hernia, with expected fundoplication defect in the proximal stomach. No evidence of leak or stricture. Electronically Signed   By: Ilona Sorrel M.D.   On: 06/06/2018 09:49    Microbiology: No results found for this or any previous visit (from the past 240 hour(s)).   Labs: Basic Metabolic Panel: Recent Labs  Lab 06/06/18 0437  NA 134*  K 4.0  CL 103  CO2 23  GLUCOSE 132*  BUN 20  CREATININE 1.23  CALCIUM 8.3*   Liver Function Tests: Recent Labs  Lab 06/06/18 0437  AST 53*  ALT 61*  ALKPHOS 39  BILITOT 0.5  PROT 6.9  ALBUMIN 4.1   No results for input(s): LIPASE, AMYLASE in the  last 168 hours. No results for input(s): AMMONIA in the last 168 hours. CBC: Recent Labs  Lab 06/05/18 1243 06/06/18 0437  WBC  --  14.6*  NEUTROABS  --  11.6*  HGB 13.1 11.7*  HCT 43.4 38.7*  MCV  --  79.8*  PLT  --  234   Cardiac Enzymes: No results for input(s): CKTOTAL, CKMB, CKMBINDEX, TROPONINI in the last 168 hours. BNP: BNP (last 3 results) No results for input(s): BNP in the last 8760 hours.  ProBNP (last 3 results) No results for input(s): PROBNP in the last 8760 hours.  CBG: No results for input(s): GLUCAP in the last 168 hours.  Active Problems:   S/P repair of paraesophageal hernia   Time coordinating discharge: 15 min  Signed:  Gayland Curry, MD [facility] Surgery, [place] [phone] 06/06/2018, 12:30 PM

## 2018-06-06 NOTE — Discharge Instructions (Signed)
EATING AFTER YOUR ESOPHAGEAL SURGERY (Stomach Fundoplication, Hiatal Hernia repair, Achalasia surgery, etc)  ######################################################################  EAT Start with a pureed / full liquid diet (see below) for next 2 weeks. If you have trouble with full liquids, go back to clear liquid diet Gradually transition to a high fiber diet with a fiber supplement over the next month after discharge.    WALK Walk an hour a day.  Control your pain to do that.    CONTROL PAIN Control pain so that you can walk, sleep, tolerate sneezing/coughing, go up/down stairs.  HAVE A BOWEL MOVEMENT DAILY Keep your bowels regular to avoid problems.  OK to try a laxative to override constipation.  OK to use an antidairrheal to slow down diarrhea.  Call if not better after 2 tries  CALL IF YOU HAVE PROBLEMS/CONCERNS Call if you are still struggling despite following these instructions. Call if you have concerns not answered by these instructions  ######################################################################   After your esophageal surgery, expect some sticking with swallowing over the next 1-2 months.    If food sticks when you eat, it is called "dysphagia".  This is due to swelling around your esophagus at the wrap & hiatal diaphragm repair.  It will gradually ease off over the next few months.  To help you through this temporary phase, we start you out on a pureed (blenderized) diet.  Your first meal in the hospital was thin liquids.  You should have been given a pureed diet by the time you left the hospital.  We ask patients to stay on a pureed diet for the first 2-3 weeks to avoid anything getting "stuck" near your recent surgery.  Don't be alarmed if your ability to swallow doesn't progress according to this plan.  Everyone is different and some diets can advance more or less quickly.     Some BASIC RULES to follow are:  Maintain an upright position whenever eating  or drinking.  Take small bites - just a teaspoon size bite at a time.  Eat slowly.  It may also help to eat only one food at a time.  Consider nibbling through smaller, more frequent meals & avoid the urge to eat BIG meals  Do not push through feelings of fullness, nausea, or bloatedness  Do not mix solid foods and liquids in the same mouthful  Try not to "wash foods down" with large gulps of liquids.  Avoid carbonated (bubbly/fizzy) drinks.    Avoid foods that make you feel gassy or bloated.  Start with bland foods first.  Wait on trying greasy, fried, or spicy meals until you are tolerating more bland solids well.  Understand that it will be hard to burp and belch at first.  This gradually improves with time.  Expect to be more gassy/flatulent/bloated initially.  Walking will help your body manage it better.  Consider using medications for bloating that contain simethicone such as  Maalox or Gas-X   Eat in a relaxed atmosphere & minimize distractions.  Avoid talking while eating.    Do not use straws.  Following each meal, sit in an upright position (90 degree angle) for 60 to 90 minutes.  Going for a short walk can help as well  If food does stick, don't panic.  Try to relax and let the food pass on its own.  Sipping WARM LIQUID such as strong hot black tea can also help slide it down.   Be gradual in changes & use common sense:  -If  you easily tolerating a certain "level" of foods, advance to the next level gradually -If you are having trouble swallowing a particular food, then avoid it.   -If food is sticking when you advance your diet, go back to thinner previous diet (the lower LEVEL) for 1-2 days.  LEVEL 1 = PUREED DIET  Do for the first 2 WEEKS AFTER SURGERY  -Foods in this group are pureed or blenderized to a smooth, mashed potato-like consistency.  -If necessary, the pureed foods can keep their shape with the addition of a thickening agent.   -Meat should be  pureed to a smooth, pasty consistency.  Hot broth or gravy may be added to the pureed meat, approximately 1 oz. of liquid per 3 oz. serving of meat. -CAUTION:  If any foods do not puree into a smooth consistency, swallowing will be more difficult.  (For example, nuts or seeds sometimes do not blend well.)  Hot Foods Cold Foods  Pureed scrambled eggs and cheese Pureed cottage cheese  Baby cereals Thickened juices and nectars  Thinned cooked cereals (no lumps) Thickened milk or eggnog  Pureed [place] toast or pancakes Ensure  Mashed potatoes Ice cream  Pureed parsley, au gratin, scalloped potatoes, candied sweet potatoes Fruit or [place] ice, sherbet  Pureed buttered or alfredo noodles Plain yogurt  Pureed vegetables (no corn or peas) Instant breakfast  Pureed soups and creamed soups Smooth pudding, mousse, custard  Pureed scalloped apples Whipped gelatin  Gravies Sugar, syrup, honey, jelly  Sauces, cheese, tomato, barbecue, white, creamed Cream  Any baby food Creamer  Alcohol in moderation (not beer or champagne) Margarine  Coffee or tea Mayonnaise   Ketchup, mustard   Apple sauce   SAMPLE MENU:  PUREED DIET Breakfast Lunch Dinner   Orange juice, 1/2 cup  Cream of wheat, 1/2 cup  Pineapple juice, 1/2 cup  Pureed [place], barley soup, 3/4 cup  Pureed Hawaiian chicken, 3 oz   Scrambled eggs, mashed or blended with cheese, 1/2 cup  Tea or coffee, 1 cup   Whole milk, 1 cup   Non-dairy creamer, 2 Tbsp.  Mashed potatoes, 1/2 cup  Pureed cooled broccoli, 1/2 cup  Apple sauce, 1/2 cup  Coffee or tea  Mashed potatoes, 1/2 cup  Pureed spinach, 1/2 cup  Frozen yogurt, 1/2 cup  Tea or coffee      LEVEL 2 = SOFT DIET  After your first 2 weeks, you can advance to a soft diet.   Keep on this diet until everything goes down easily.  Hot Foods Cold Foods  White fish Cottage cheese  Stuffed fish Junior baby fruit  Baby food meals Semi thickened juices  Minced soft  cooked, scrambled, poached eggs nectars  Souffle & omelets Ripe mashed bananas  Cooked cereals Canned fruit, pineapple sauce, milk  potatoes Milkshake  Buttered or Alfredo noodles Custard  Cooked cooled vegetable Puddings, including tapioca  Sherbet Yogurt  Vegetable soup or alphabet soup Fruit ice, [place] ice  Gravies Whipped gelatin  Sugar, syrup, honey, jelly Junior baby desserts  Sauces:  Cheese, creamed, barbecue, tomato, white Cream  Coffee or tea Margarine   SAMPLE MENU:  LEVEL 2 Breakfast Lunch Dinner   Orange juice, 1/2 cup  Oatmeal, 1/2 cup  Scrambled eggs with cheese, 1/2 cup  Decaffeinated tea, 1 cup  Whole milk, 1 cup  Non-dairy creamer, 2 Tbsp  Pineapple juice, 1/2 cup  Minced beef, 3 oz  Gravy, 2 Tbsp  Mashed potatoes, 1/2 cup  Minced fresh broccoli,  1/2 cup  Applesauce, 1/2 cup  Coffee, 1 cup  [place], barley soup, 3/4 cup  Minced Hawaiian chicken, 3 oz  Mashed potatoes, 1/2 cup  Cooked spinach, 1/2 cup  Frozen yogurt, 1/2 cup  Non-dairy creamer, 2 Tbsp      LEVEL 3 = CHOPPED DIET  -After all the foods in level 2 (soft diet) are passing through well you should advance up to more chopped foods.  -It is still important to cut these foods into small pieces and eat slowly.  Hot Foods Cold Foods  Poultry Cottage cheese  Chopped Swedish meatballs Yogurt  Meat salads (ground or flaked meat) Milk  Flaked fish (tuna) Milkshakes  Poached or scrambled eggs Soft, cold, dry cereal  Souffles and omelets Fruit juices or nectars  Cooked cereals Chopped canned fruit  Chopped [place] toast or pancakes Canned fruit cocktail  Noodles or pasta (no rice) Pudding, mousse, custard  Cooked vegetables (no frozen peas, corn, or mixed vegetables) Green salad  Canned small sweet peas Ice cream  Creamed soup or vegetable soup Fruit ice, [place] ice  Pureed vegetable soup or alphabet soup Non-dairy creamer  Ground scalloped apples Margarine  Gravies  Mayonnaise  Sauces:  Cheese, creamed, barbecue, tomato, white Ketchup  Coffee or tea Mustard   SAMPLE MENU:  LEVEL 3 Breakfast Lunch Dinner   Orange juice, 1/2 cup  Oatmeal, 1/2 cup  Scrambled eggs with cheese, 1/2 cup  Decaffeinated tea, 1 cup  Whole milk, 1 cup  Non-dairy creamer, 2 Tbsp  Ketchup, 1 Tbsp  Margarine, 1 tsp  Salt, 1/4 tsp  Sugar, 2 tsp  Pineapple juice, 1/2 cup  Ground beef, 3 oz  Gravy, 2 Tbsp  Mashed potatoes, 1/2 cup  Cooked spinach, 1/2 cup  Applesauce, 1/2 cup  Decaffeinated coffee  Whole milk  Non-dairy creamer, 2 Tbsp  Margarine, 1 tsp  Salt, 1/4 tsp  Pureed [place], barley soup, 3/4 cup  Barbecue chicken, 3 oz  Mashed potatoes, 1/2 cup  Ground fresh broccoli, 1/2 cup  Frozen yogurt, 1/2 cup  Decaffeinated tea, 1 cup  Non-dairy creamer, 2 Tbsp  Margarine, 1 tsp  Salt, 1/4 tsp  Sugar, 1 tsp    LEVEL 4:  REGULAR FOODS  -Foods in this group are soft, moist, regularly textured foods.   -This level includes meat and breads, which tend to be the hardest things to swallow.   -Eat very slowly, chew well and continue to avoid carbonated drinks. -most people are at this level in 4-6 weeks  Hot Foods Cold Foods  Baked fish or skinned Soft cheeses - cottage cheese  Souffles and omelets Cream cheese  Eggs Yogurt  Stuffed shells Milk  Spaghetti with meat sauce Milkshakes  Cooked cereal Cold dry cereals (no nuts, dried fruit, coconut)  [place] toast or pancakes Crackers  Buttered toast Fruit juices or nectars  Noodles or pasta (no rice) Canned fruit  Potatoes (all types) Ripe bananas  Soft, cooked vegetables (no corn, lima, or baked beans) Peeled, ripe, fresh fruit  Creamed soups or vegetable soup Cakes (no nuts, dried fruit, coconut)  Canned chicken noodle soup Plain doughnuts  Gravies Ice cream  Bacon dressing Pudding, mousse, custard  Sauces:  Cheese, creamed, barbecue, tomato, white Fruit ice, [place] ice, sherbet    Decaffeinated tea or coffee Whipped gelatin  Pork chops Regular gelatin   Canned fruited gelatin molds   Sugar, syrup, honey, jam, jelly   Cream   Non-dairy   Margarine   Oil  Mayonnaise   Ketchup   Mustard   TROUBLESHOOTING IRREGULAR BOWELS  1) Avoid extremes of bowel movements (no bad constipation/diarrhea)  2) Miralax 17gm mixed in 8oz. water or juice-daily. May use BID as needed.  3) Gas-x,Phazyme, etc. as needed for gas & bloating.  4) Soft,bland diet. No spicy,greasy,fried foods.  5) Prilosec over-the-counter as needed  6) May hold gluten/wheat products from diet to see if symptoms improve.  7) May try probiotics (Align, Activa, etc) to help calm the bowels down  7) If symptoms become worse call back immediately.    If you have any questions please call our office at [place]: [phone].  [facility] LAPAROSCOPIC [facility]GERY: POST OP INSTRUCTIONS Always review your discharge instruction sheet given to you by the facility where your surgery was performed. IF YOU HAVE DISABILITY OR FAMILY LEAVE FORMS, YOU MUST BRING THEM TO THE OFFICE FOR PROCESSING.   DO NOT GIVE THEM TO YOUR DOCTOR.  PAIN CONTROL  1. First take acetaminophen (Tylenol) AND/or ibuprofen (Advil) to control your pain after surgery.  Follow directions on package.  Taking acetaminophen (Tylenol) and/or ibuprofen (Advil) regularly after surgery will help to control your pain and lower the amount of prescription pain medication you may need.  You should not take more than 3,000 mg (3 grams) of acetaminophen (Tylenol) in 24 hours.  You should not take ibuprofen (Advil), aleve, motrin, naprosyn or other NSAIDS if you have a history of stomach ulcers or chronic kidney disease.  2. A prescription for pain medication may be given to you upon discharge.  Take your pain medication as prescribed, if you still have uncontrolled pain after taking acetaminophen (Tylenol) or ibuprofen  (Advil). 3. Use ice packs to help control pain. 4. If you need a refill on your pain medication, please contact your pharmacy.  They will contact our office to request authorization. Prescriptions will not be filled after 5pm or on week-ends.  HOME MEDICATIONS 5. Take your usually prescribed medications unless otherwise directed.  DIET 6. See diet instructions  CONSTIPATION 7. It is common to experience some constipation after surgery and if you are taking pain medication.  Increasing fluid intake and taking a stool softener (such as Colace) will usually help or prevent this problem from occurring.  A mild laxative (Milk of Magnesia or Miralax) should be taken according to package instructions if there are no bowel movements after 48 hours.  WOUND/INCISION CARE 8. Most patients will experience some swelling and bruising in the area of the incisions.  Ice packs will help.  Swelling and bruising can take several days to resolve.  9. Unless discharge instructions indicate otherwise, follow guidelines below  a. STERI-STRIPS - you may remove your outer bandages 48 hours after surgery, and you may shower at that time.  You have steri-strips (small skin tapes) in place directly over the incision.  These strips should be left on the skin for 7-10 days.   b. DERMABOND/SKIN GLUE - you may shower in 24 hours.  The glue will flake off over the next 2-3 weeks. 10. Any sutures or staples will be removed at the office during your follow-up visit.  ACTIVITIES 11. You may resume regular (light) daily activities beginning the next day--such as daily self-care, walking, climbing stairs--gradually increasing activities as tolerated.  You may have sexual intercourse when it is comfortable.  Refrain from any heavy lifting or straining until approved by your doctor. a. You may drive when you are no  longer taking prescription pain medication, you can comfortably wear a seatbelt, and you can safely maneuver your car  and apply brakes.  FOLLOW-UP 12. You should see your doctor in the office for a follow-up appointment approximately 2-3 weeks after your surgery.  You should have been given your post-op/follow-up appointment when your surgery was scheduled.  If you did not receive a post-op/follow-up appointment, make sure that you call for this appointment within a day or two after you arrive home to insure a convenient appointment time.  OTHER INSTRUCTIONS 13.   WHEN TO CALL YOUR DOCTOR: 1. Fever over 101.0 2. Inability to urinate 3. Continued bleeding from incision. 4. Increased pain, redness, or drainage from the incision. 5. Increasing abdominal pain  The clinic staff is available to answer your questions during regular business hours.  Please dont hesitate to call and ask to speak to one of the nurses for clinical concerns.  If you have a medical emergency, go to the nearest emergency room or call 911.  A surgeon from [facility] is always on call at the hospital. [address], [place], [place], [place]  [address] ? [address] [place], [place], [place]   [address] [phone] ? [phone] ? FAX [phone] Web site: [email or web address]     Managing Your Pain After Surgery Without Opioids    Thank you for participating in our program to help patients manage their pain after surgery without opioids. This is part of our effort to provide you with the best care possible, without exposing you or your family to the risk that opioids pose.  What pain can I expect after surgery? You can expect to have some pain after surgery. This is normal. The pain is typically worse the day after surgery, and quickly begins to get better. Many studies have found that many patients are able to manage their pain after surgery with Over-the-Counter (OTC) medications such as Tylenol and Motrin. If you have a condition that does not allow you to take Tylenol or Motrin, notify your  surgical team.  How will I manage my pain? The best strategy for controlling your pain after surgery is around the clock pain control with Tylenol (acetaminophen) and Motrin (ibuprofen or Advil). Alternating these medications with each other allows you to maximize your pain control. In addition to Tylenol and Motrin, you can use heating pads or ice packs on your incisions to help reduce your pain.  How will I alternate your regular strength over-the-counter pain medication? You will take a dose of pain medication every three hours. ; Start by taking 650 mg of Tylenol (2 pills of 325 mg) ; 3 hours later take 600 mg of Motrin (3 pills of 200 mg) ; 3 hours after taking the Motrin take 650 mg of Tylenol ; 3 hours after that take 600 mg of Motrin.   - 1 -  See example - if your first dose of Tylenol is at 12:00 PM   12:00 PM Tylenol 650 mg (2 pills of 325 mg)  3:00 PM Motrin 600 mg (3 pills of 200 mg)  6:00 PM Tylenol 650 mg (2 pills of 325 mg)  9:00 PM Motrin 600 mg (3 pills of 200 mg)  Continue alternating every 3 hours   We recommend that you follow this schedule around-the-clock for at least 3 days after surgery, or until you feel that it is no longer needed. Use the table on the last page of this handout to keep track  of the medications you are taking. Important: Do not take more than 3000mg  of Tylenol or 3200mg  of Motrin in a 24-hour period. Do not take ibuprofen/Motrin if you have a history of bleeding stomach ulcers, severe kidney disease, &/or actively taking a blood thinner  What if I still have pain? If you have pain that is not controlled with the over-the-counter pain medications (Tylenol and Motrin or Advil) you might have what we call breakthrough pain. You will receive a prescription for a small amount of an opioid pain medication such as Oxycodone, Tramadol, or Tylenol with Codeine. Use these opioid pills in the first 24 hours after surgery if you have breakthrough pain.  Do not take more than 1 pill every 4-6 hours.  If you still have uncontrolled pain after using all opioid pills, don't hesitate to call our staff using the number provided. We will help make sure you are managing your pain in the best way possible, and if necessary, we can provide a prescription for additional pain medication.   Day 1    Time  Name of Medication Number of pills taken  Amount of Acetaminophen  Pain Level   Comments  AM PM       AM PM       AM PM       AM PM       AM PM       AM PM       AM PM       AM PM       Total Daily amount of Acetaminophen Do not take more than  3,000 mg per day      Day 2    Time  Name of Medication Number of pills taken  Amount of Acetaminophen  Pain Level   Comments  AM PM       AM PM       AM PM       AM PM       AM PM       AM PM       AM PM       AM PM       Total Daily amount of Acetaminophen Do not take more than  3,000 mg per day      Day 3    Time  Name of Medication Number of pills taken  Amount of Acetaminophen  Pain Level   Comments  AM PM       AM PM       AM PM       AM PM          AM PM       AM PM       AM PM       AM PM       Total Daily amount of Acetaminophen Do not take more than  3,000 mg per day      Day 4    Time  Name of Medication Number of pills taken  Amount of Acetaminophen  Pain Level   Comments  AM PM       AM PM       AM PM       AM PM       AM PM       AM PM       AM PM       AM PM       Total Daily amount  of Acetaminophen Do not take more than  3,000 mg per day      Day 5    Time  Name of Medication Number of pills taken  Amount of Acetaminophen  Pain Level   Comments  AM PM       AM PM       AM PM       AM PM       AM PM       AM PM       AM PM       AM PM       Total Daily amount of Acetaminophen Do not take more than  3,000 mg per day       Day 6    Time  Name of Medication Number of pills taken  Amount of Acetaminophen  Pain  Level  Comments  AM PM       AM PM       AM PM       AM PM       AM PM       AM PM       AM PM       AM PM       Total Daily amount of Acetaminophen Do not take more than  3,000 mg per day      Day 7    Time  Name of Medication Number of pills taken  Amount of Acetaminophen  Pain Level   Comments  AM PM       AM PM       AM PM       AM PM       AM PM       AM PM       AM PM       AM PM       Total Daily amount of Acetaminophen Do not take more than  3,000 mg per day        For additional information about how and where to safely dispose of unused opioid medications - [employer]  Disclaimer: This document contains information and/or instructional materials adapted from [place] for the typical patient with your condition. It does not replace medical advice from your health care provider because your experience may differ from that of the typical patient. Talk to your health care provider if you have any questions about this document, your condition or your treatment plan. Adapted from [place]

## 2018-06-06 NOTE — Progress Notes (Signed)
1 Day Post-Op   Subjective/Chief Complaint: Min pain. Reports clears went well No nausea/vomiting No sensation of liquid getting stuck Ambulated several times   Objective: Vital signs in last 24 hours: Temp:  [97.5 F (36.4 C)-98.9 F (37.2 C)] 98.9 F (37.2 C) (02/19 0609) Pulse Rate:  [68-91] 71 (02/19 0609) Resp:  [13-20] 20 (02/19 0609) BP: (122-160)/(79-97) 142/88 (02/19 0609) SpO2:  [94 %-100 %] 98 % (02/19 0609) Last BM Date: 06/04/18  Intake/Output from previous day: 02/18 0701 - 02/19 0700 In: 4041.9 [P.O.:537; I.V.:3254.9; IV Piggyback:250] Out: 1850 [Urine:1800; Blood:50] Intake/Output this shift: No intake/output data recorded.  Alert, nad Nontoxic Girlfriend at [place] Soft, mild exp TTP, incisions ok No edema  Lab Results:  Recent Labs    06/05/18 1243 06/06/18 0437  WBC  --  14.6*  HGB 13.1 11.7*  HCT 43.4 38.7*  PLT  --  234   BMET Recent Labs    06/06/18 0437  NA 134*  K 4.0  CL 103  CO2 23  GLUCOSE 132*  BUN 20  CREATININE 1.23  CALCIUM 8.3*   PT/INR No results for input(s): LABPROT, INR in the last 72 hours. ABG No results for input(s): PHART, HCO3 in the last 72 hours.  Invalid input(s): PCO2, PO2  Studies/Results: No results found.  Anti-infectives: Anti-infectives (From admission, onward)   Start     Dose/Rate Route Frequency Ordered Stop   06/05/18 0600  cefoTEtan (CEFOTAN) 2 g in sodium chloride 0.9 % 100 mL IVPB     2 g 200 mL/hr over 30 Minutes Intravenous On call to O.R. 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

## 2018-06-06 NOTE — Plan of Care (Signed)
Nutrition Education Note  RD consulted for nutrition education regarding patient who is s/p nissen fundoplication.  RD provided handout on Nissen Fundoplication nutrition therapy. Reviewed clear and full liquids. Encouraged pt to avoid caffeine, carbonated beverages and use of straws.  Provided examples of appropriate foods on a soft diet. Reviewed gas producing foods. Discouraged intake of processed foods and red meat. Recommended use of a liquid multivitamin while following a liquid diet. Reviewed acceptable protein supplements.  RD discussed why it is important for patient to adhere to diet recommendations. Teach back method used.  Expect good compliance.   Body mass index is 35.26 kg/m.  Pt meets criteria for obesity based on current BMI.  Current diet order is full liquids. Labs and medications reviewed. No further nutrition interventions warranted at this time. If additional nutrition issues arise, please re-consult RD.   Chase Bibles, MS, RD, [place] Dietitian Pager: [phone] After Hours Pager: [phone]

## 2020-06-23 ENCOUNTER — Encounter: Payer: Self-pay | Admitting: Gastroenterology

## 2020-09-19 IMAGING — CT CT ABD-PELV W/ CM
2 of 5 series · 13 of 46 positions shown, 15 images · IV contrast (iopamidol)
Comparison: Fluoroscopy exam of April 19, 2018.

CLINICAL DATA: Gastroesophageal reflux disease, abdominal bloating,
diarrhea.

EXAM:
CT ABDOMEN AND PELVIS WITH CONTRAST
TECHNIQUE: Multidetector CT imaging of the abdomen and pelvis was performed
using the standard protocol following bolus administration of
intravenous contrast.
CONTRAST:  100mL [id number] IOPAMIDOL ([id number]) INJECTION 61%

[Series 2: abd pelvis 5.00 br40 s3 ax · axial · 0.66mm/px · z∈[+1033,+1513]mm · 10 of 108 slices shown, 12 images]
[im 6/108  soft-tissue]
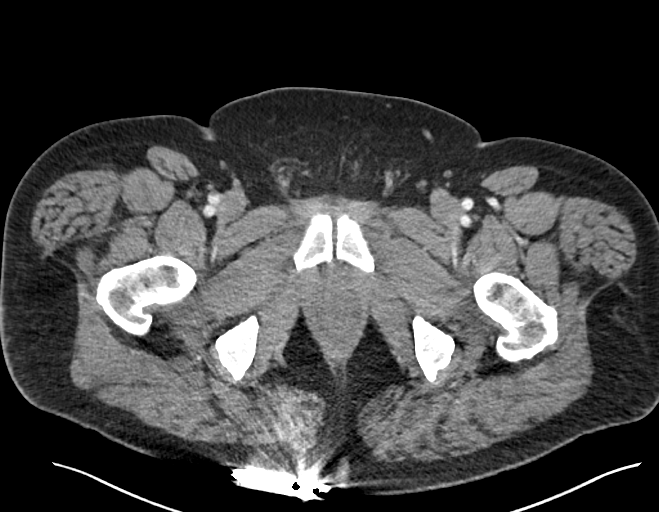
[im 6/108  bone]
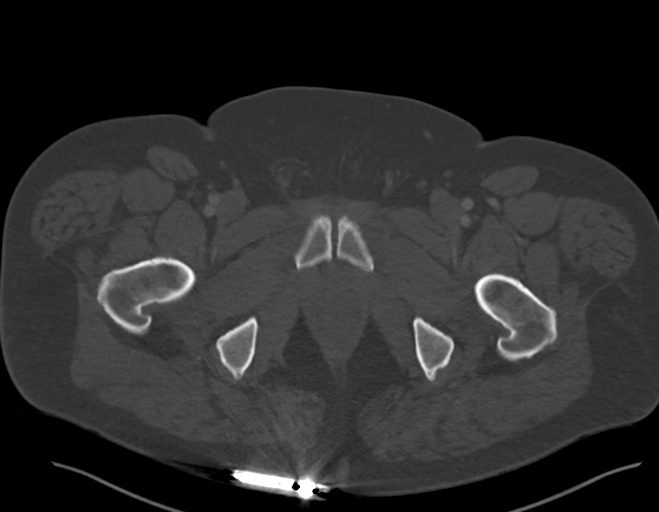
[im 18/108  soft-tissue]
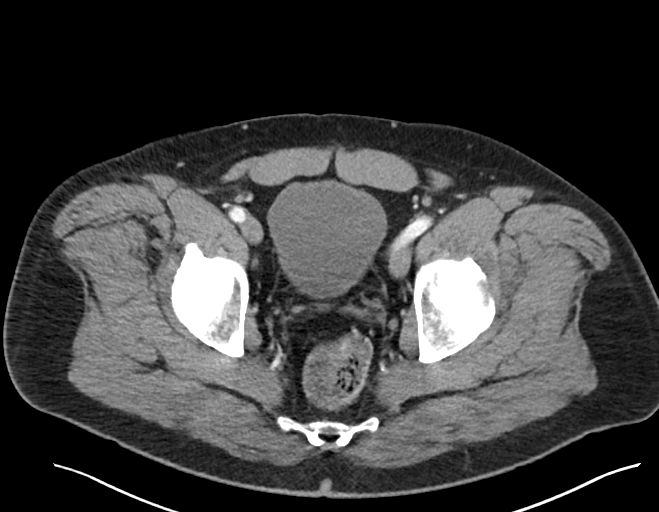
[im 30/108  soft-tissue]
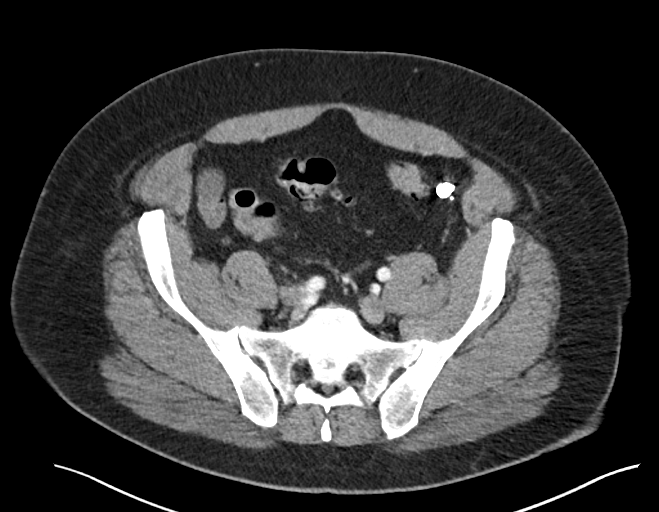
[im 36/108  soft-tissue]
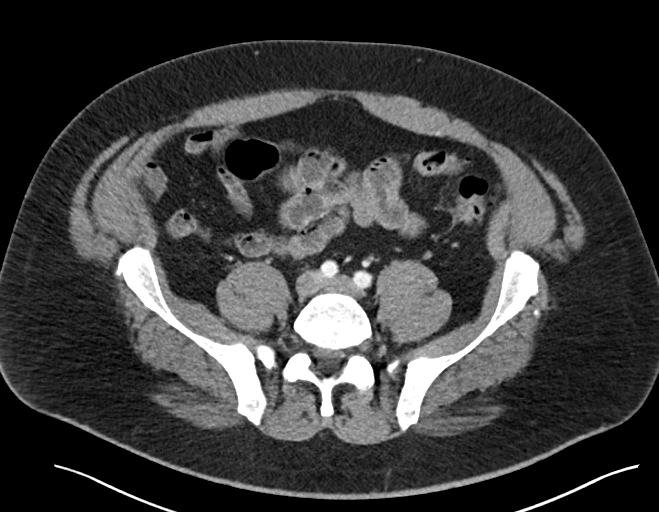
[im 48/108  soft-tissue]
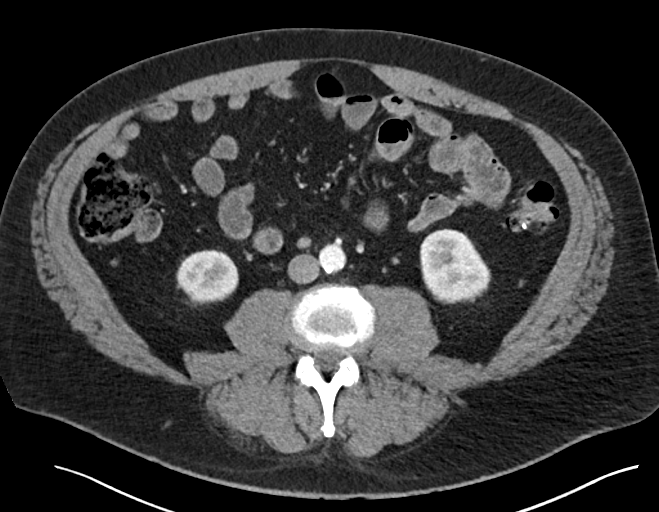
[im 60/108  soft-tissue]
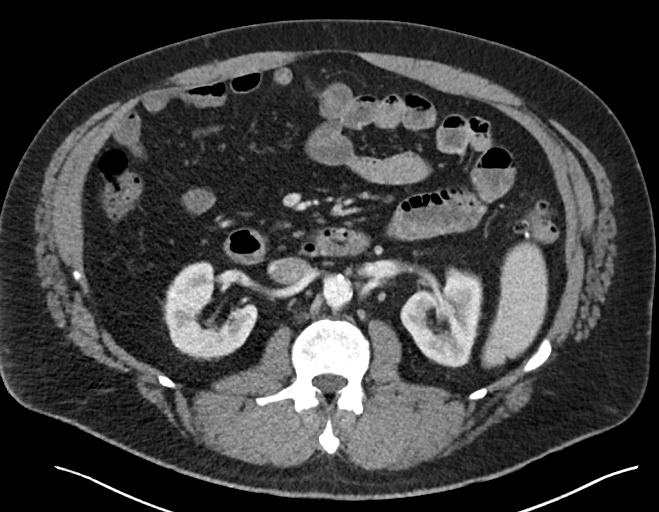
[im 72/108  soft-tissue]
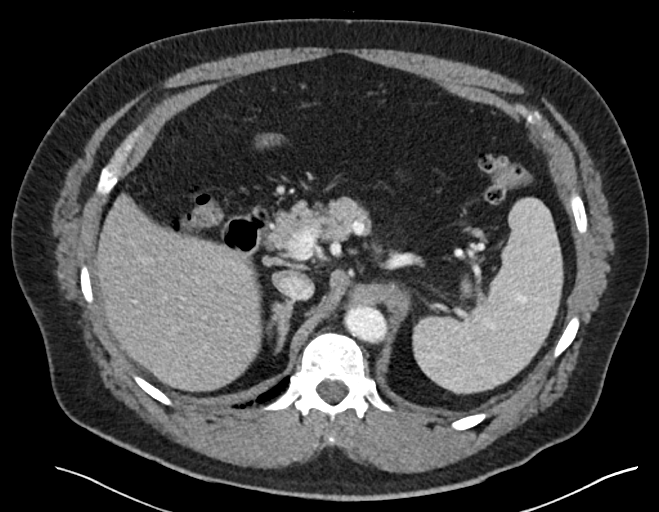
[im 78/108  soft-tissue]
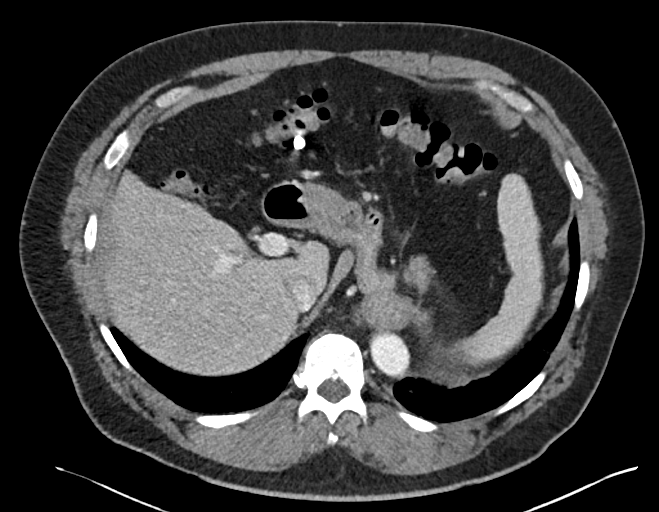
[im 90/108  soft-tissue]
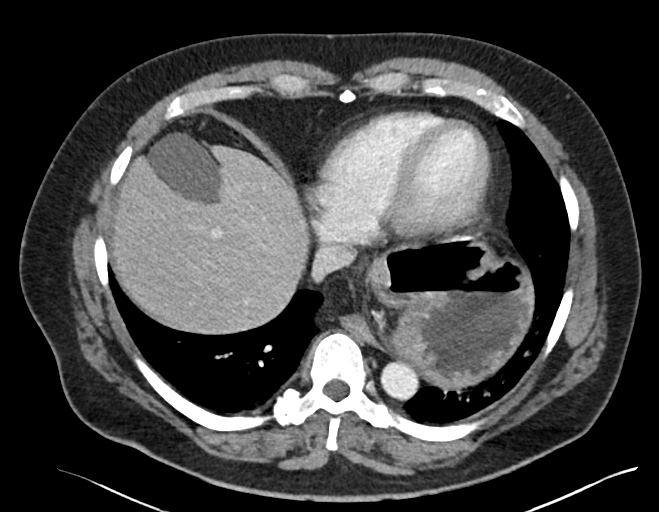
[im 90/108  bone]
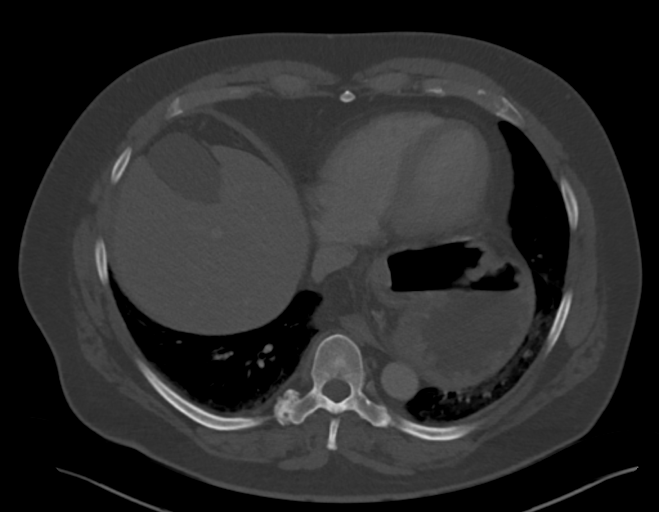
[im 102/108  soft-tissue]
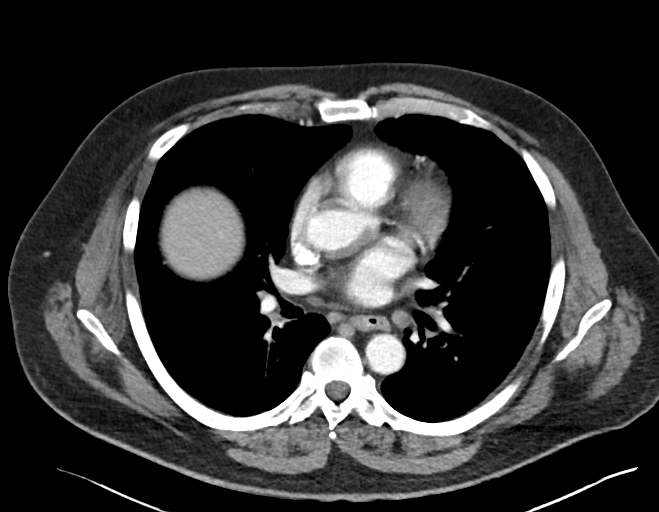

[Series 6: abd pelvis 2.00 br40 s3 cor · coronal · 0.86mm/px · 3 of 161 slices shown]
[im 54/161  soft-tissue]
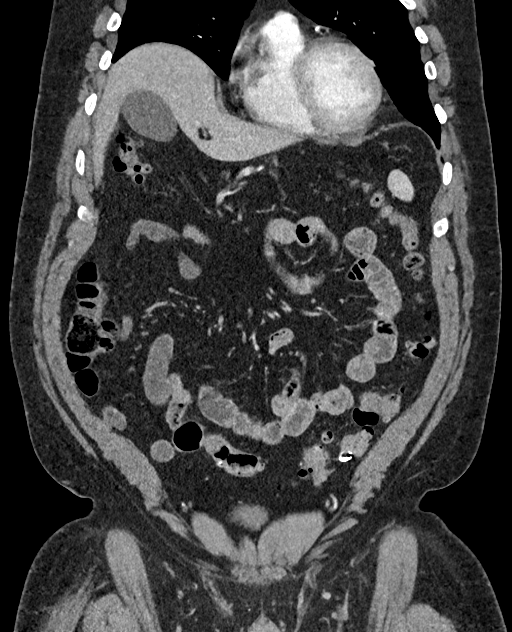
[im 72/161  soft-tissue]
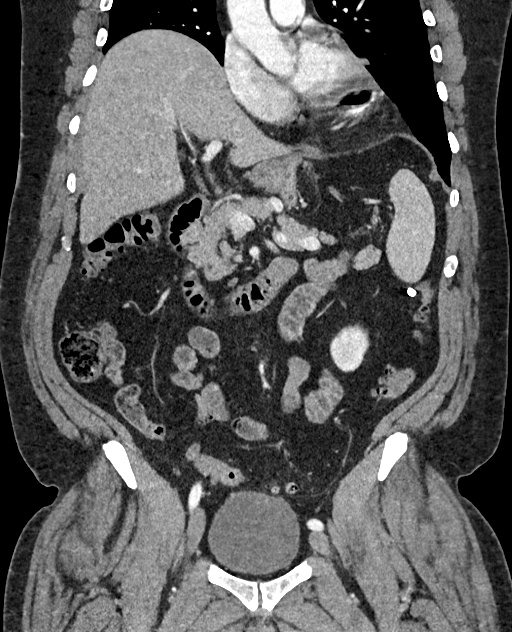
[im 89/161  soft-tissue]
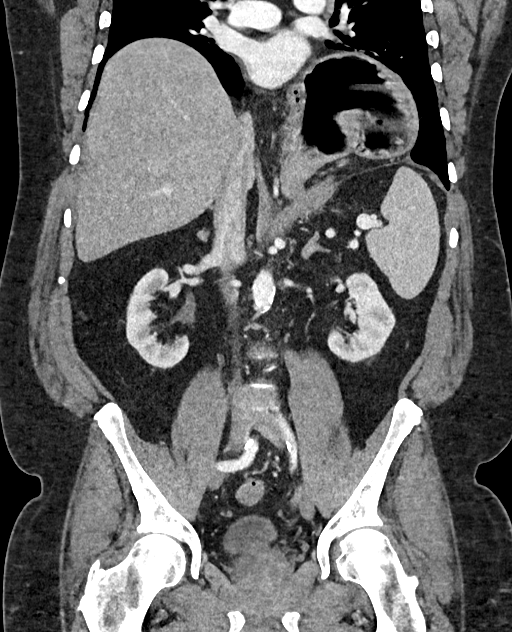

[13 of 46 positions shown; findings below may reference images not displayed]

FINDINGS: Lower chest: Visualized lung bases are unremarkable.

Hepatobiliary: No focal liver abnormality is seen. No gallstones,
gallbladder wall thickening, or biliary dilatation.

Pancreas: Unremarkable. No pancreatic ductal dilatation or
surrounding inflammatory changes.

Spleen: Normal in size without focal abnormality.

Adrenals/Urinary Tract: Adrenal glands are unremarkable. Kidneys are
normal, without renal calculi, focal lesion, or hydronephrosis.
Bladder is unremarkable.

Stomach/Bowel: There is noted a large hiatal hernia with most of the
stomach within the thoracic space. The gastroesophageal junction is
seen at the level of the hiatus, and therefore it is difficult to
determine if this is paraesophageal or sliding-type. There does
appear to be significant narrowing involving the junction of the
middle and distal thirds of the stomach which may be causing some
degree of obstruction. No evidence of small or large bowel
dilatation is noted. Sigmoid diverticulosis is noted without
inflammation. The appendix appears normal.

Vascular/Lymphatic: Aortic atherosclerosis. No enlarged abdominal or
pelvic lymph nodes.

Reproductive: Prostate is unremarkable.

Other: No abdominal wall hernia or abnormality. No abdominopelvic
ascites.

Musculoskeletal: No acute or significant osseous findings.
IMPRESSION: Large hiatal hernia is noted with most of the stomach within the
thoracic space. The gastroesophageal junction is at the level of the
hiatus, and therefore it is difficult determine if this is
paraesophageal or sliding-type hernia. There does appear to be
significant narrowing involving the junction of the middle and
distal thirds of the stomach at the level of the hiatus which may be
causing some degree of obstruction. Volvulus can not be excluded
although is felt to be less likely.

Sigmoid diverticulosis without inflammation.
